# Patient Record
Sex: Female | Born: 1975 | Race: Black or African American | Hispanic: No | Marital: Single | Smoking: Former smoker
Health system: Southern US, Community
[De-identification: ages and names within clinical notes are randomized; demographics above are authoritative.]

## PROBLEM LIST (undated history)

## (undated) DIAGNOSIS — F1910 Other psychoactive substance abuse, uncomplicated: Secondary | ICD-10-CM

## (undated) DIAGNOSIS — Z789 Other specified health status: Secondary | ICD-10-CM

## (undated) DIAGNOSIS — K56609 Unspecified intestinal obstruction, unspecified as to partial versus complete obstruction: Secondary | ICD-10-CM

## (undated) DIAGNOSIS — F419 Anxiety disorder, unspecified: Secondary | ICD-10-CM

## (undated) HISTORY — DX: Anxiety disorder, unspecified: F41.9

---

## 1997-06-21 ENCOUNTER — Ambulatory Visit (HOSPITAL_COMMUNITY): Admission: RE | Admit: 1997-06-21 | Discharge: 1997-06-21 | Payer: Self-pay | Admitting: *Deleted

## 1997-09-05 ENCOUNTER — Other Ambulatory Visit: Admission: RE | Admit: 1997-09-05 | Discharge: 1997-09-05 | Payer: Self-pay | Admitting: Obstetrics

## 1997-09-10 ENCOUNTER — Ambulatory Visit (HOSPITAL_COMMUNITY): Admission: RE | Admit: 1997-09-10 | Discharge: 1997-09-10 | Payer: Self-pay | Admitting: Obstetrics

## 1998-02-24 ENCOUNTER — Emergency Department (HOSPITAL_COMMUNITY): Admission: EM | Admit: 1998-02-24 | Discharge: 1998-02-24 | Payer: Self-pay | Admitting: Emergency Medicine

## 1998-04-11 ENCOUNTER — Inpatient Hospital Stay (HOSPITAL_COMMUNITY): Admission: AD | Admit: 1998-04-11 | Discharge: 1998-04-11 | Payer: Self-pay | Admitting: *Deleted

## 1998-05-04 ENCOUNTER — Inpatient Hospital Stay (HOSPITAL_COMMUNITY): Admission: AD | Admit: 1998-05-04 | Discharge: 1998-05-04 | Payer: Self-pay | Admitting: *Deleted

## 1998-08-12 ENCOUNTER — Inpatient Hospital Stay (HOSPITAL_COMMUNITY): Admission: AD | Admit: 1998-08-12 | Discharge: 1998-08-12 | Payer: Self-pay | Admitting: Obstetrics

## 1998-08-14 ENCOUNTER — Inpatient Hospital Stay (HOSPITAL_COMMUNITY): Admission: AD | Admit: 1998-08-14 | Discharge: 1998-08-14 | Payer: Self-pay | Admitting: *Deleted

## 1998-08-18 ENCOUNTER — Inpatient Hospital Stay (HOSPITAL_COMMUNITY): Admission: AD | Admit: 1998-08-18 | Discharge: 1998-08-18 | Payer: Self-pay | Admitting: *Deleted

## 1998-08-20 ENCOUNTER — Inpatient Hospital Stay (HOSPITAL_COMMUNITY): Admission: AD | Admit: 1998-08-20 | Discharge: 1998-08-20 | Payer: Self-pay | Admitting: *Deleted

## 1998-10-29 ENCOUNTER — Inpatient Hospital Stay (HOSPITAL_COMMUNITY): Admission: EM | Admit: 1998-10-29 | Discharge: 1998-10-29 | Payer: Self-pay | Admitting: *Deleted

## 1998-12-13 ENCOUNTER — Emergency Department (HOSPITAL_COMMUNITY): Admission: EM | Admit: 1998-12-13 | Discharge: 1998-12-13 | Payer: Self-pay | Admitting: Emergency Medicine

## 1998-12-31 ENCOUNTER — Inpatient Hospital Stay (HOSPITAL_COMMUNITY): Admission: AD | Admit: 1998-12-31 | Discharge: 1998-12-31 | Payer: Self-pay | Admitting: *Deleted

## 1999-01-14 ENCOUNTER — Inpatient Hospital Stay (HOSPITAL_COMMUNITY): Admission: AD | Admit: 1999-01-14 | Discharge: 1999-01-14 | Payer: Self-pay | Admitting: *Deleted

## 1999-01-16 ENCOUNTER — Emergency Department (HOSPITAL_COMMUNITY): Admission: EM | Admit: 1999-01-16 | Discharge: 1999-01-16 | Payer: Self-pay | Admitting: Emergency Medicine

## 1999-02-06 ENCOUNTER — Encounter: Payer: Self-pay | Admitting: Emergency Medicine

## 1999-02-06 ENCOUNTER — Emergency Department (HOSPITAL_COMMUNITY): Admission: EM | Admit: 1999-02-06 | Discharge: 1999-02-06 | Payer: Self-pay | Admitting: Emergency Medicine

## 1999-02-09 ENCOUNTER — Inpatient Hospital Stay (HOSPITAL_COMMUNITY): Admission: AD | Admit: 1999-02-09 | Discharge: 1999-02-09 | Payer: Self-pay | Admitting: *Deleted

## 1999-02-22 ENCOUNTER — Inpatient Hospital Stay (HOSPITAL_COMMUNITY): Admission: AD | Admit: 1999-02-22 | Discharge: 1999-02-22 | Payer: Self-pay | Admitting: Obstetrics & Gynecology

## 1999-03-21 ENCOUNTER — Inpatient Hospital Stay (HOSPITAL_COMMUNITY): Admission: AD | Admit: 1999-03-21 | Discharge: 1999-03-21 | Payer: Self-pay | Admitting: Obstetrics and Gynecology

## 1999-03-25 ENCOUNTER — Ambulatory Visit (HOSPITAL_COMMUNITY): Admission: RE | Admit: 1999-03-25 | Discharge: 1999-03-25 | Payer: Self-pay | Admitting: *Deleted

## 1999-04-11 ENCOUNTER — Inpatient Hospital Stay (HOSPITAL_COMMUNITY): Admission: AD | Admit: 1999-04-11 | Discharge: 1999-04-11 | Payer: Self-pay | Admitting: Obstetrics and Gynecology

## 1999-04-21 ENCOUNTER — Inpatient Hospital Stay (HOSPITAL_COMMUNITY): Admission: AD | Admit: 1999-04-21 | Discharge: 1999-04-21 | Payer: Self-pay | Admitting: *Deleted

## 1999-04-22 ENCOUNTER — Ambulatory Visit (HOSPITAL_COMMUNITY): Admission: RE | Admit: 1999-04-22 | Discharge: 1999-04-22 | Payer: Self-pay | Admitting: Obstetrics and Gynecology

## 1999-04-22 ENCOUNTER — Encounter: Payer: Self-pay | Admitting: Obstetrics and Gynecology

## 1999-05-02 ENCOUNTER — Emergency Department (HOSPITAL_COMMUNITY): Admission: EM | Admit: 1999-05-02 | Discharge: 1999-05-02 | Payer: Self-pay | Admitting: Emergency Medicine

## 1999-05-24 ENCOUNTER — Inpatient Hospital Stay (HOSPITAL_COMMUNITY): Admission: AD | Admit: 1999-05-24 | Discharge: 1999-05-24 | Payer: Self-pay | Admitting: *Deleted

## 1999-06-20 ENCOUNTER — Inpatient Hospital Stay (HOSPITAL_COMMUNITY): Admission: AD | Admit: 1999-06-20 | Discharge: 1999-06-20 | Payer: Self-pay | Admitting: Obstetrics & Gynecology

## 1999-07-06 ENCOUNTER — Inpatient Hospital Stay (HOSPITAL_COMMUNITY): Admission: AD | Admit: 1999-07-06 | Discharge: 1999-07-06 | Payer: Self-pay | Admitting: Obstetrics

## 1999-07-18 ENCOUNTER — Inpatient Hospital Stay (HOSPITAL_COMMUNITY): Admission: AD | Admit: 1999-07-18 | Discharge: 1999-07-18 | Payer: Self-pay | Admitting: Obstetrics

## 1999-07-24 ENCOUNTER — Inpatient Hospital Stay (HOSPITAL_COMMUNITY): Admission: AD | Admit: 1999-07-24 | Discharge: 1999-07-24 | Payer: Self-pay | Admitting: Obstetrics

## 1999-07-27 ENCOUNTER — Inpatient Hospital Stay (HOSPITAL_COMMUNITY): Admission: AD | Admit: 1999-07-27 | Discharge: 1999-07-27 | Payer: Self-pay | Admitting: *Deleted

## 1999-08-16 ENCOUNTER — Inpatient Hospital Stay (HOSPITAL_COMMUNITY): Admission: AD | Admit: 1999-08-16 | Discharge: 1999-08-16 | Payer: Self-pay | Admitting: Obstetrics

## 1999-08-25 ENCOUNTER — Inpatient Hospital Stay (HOSPITAL_COMMUNITY): Admission: AD | Admit: 1999-08-25 | Discharge: 1999-08-25 | Payer: Self-pay | Admitting: Obstetrics

## 1999-08-27 ENCOUNTER — Inpatient Hospital Stay (HOSPITAL_COMMUNITY): Admission: AD | Admit: 1999-08-27 | Discharge: 1999-08-27 | Payer: Self-pay | Admitting: Obstetrics

## 1999-09-01 ENCOUNTER — Encounter (HOSPITAL_COMMUNITY): Admission: RE | Admit: 1999-09-01 | Discharge: 1999-09-04 | Payer: Self-pay | Admitting: Obstetrics

## 1999-09-03 ENCOUNTER — Inpatient Hospital Stay (HOSPITAL_COMMUNITY): Admission: AD | Admit: 1999-09-03 | Discharge: 1999-09-06 | Payer: Self-pay | Admitting: Internal Medicine

## 2000-01-03 ENCOUNTER — Emergency Department (HOSPITAL_COMMUNITY): Admission: EM | Admit: 2000-01-03 | Discharge: 2000-01-03 | Payer: Self-pay | Admitting: Emergency Medicine

## 2000-01-03 ENCOUNTER — Encounter: Payer: Self-pay | Admitting: Emergency Medicine

## 2000-03-02 ENCOUNTER — Inpatient Hospital Stay (HOSPITAL_COMMUNITY): Admission: AD | Admit: 2000-03-02 | Discharge: 2000-03-02 | Payer: Self-pay | Admitting: Obstetrics

## 2000-03-23 ENCOUNTER — Inpatient Hospital Stay (HOSPITAL_COMMUNITY): Admission: AD | Admit: 2000-03-23 | Discharge: 2000-03-23 | Payer: Self-pay | Admitting: Obstetrics

## 2000-05-03 ENCOUNTER — Encounter: Payer: Self-pay | Admitting: Obstetrics

## 2000-05-03 ENCOUNTER — Ambulatory Visit (HOSPITAL_COMMUNITY): Admission: RE | Admit: 2000-05-03 | Discharge: 2000-05-03 | Payer: Self-pay | Admitting: Obstetrics

## 2000-06-01 ENCOUNTER — Inpatient Hospital Stay (HOSPITAL_COMMUNITY): Admission: AD | Admit: 2000-06-01 | Discharge: 2000-06-01 | Payer: Self-pay | Admitting: Obstetrics

## 2000-06-29 ENCOUNTER — Inpatient Hospital Stay (HOSPITAL_COMMUNITY): Admission: AD | Admit: 2000-06-29 | Discharge: 2000-06-29 | Payer: Self-pay | Admitting: Obstetrics

## 2000-09-09 ENCOUNTER — Inpatient Hospital Stay (HOSPITAL_COMMUNITY): Admission: AD | Admit: 2000-09-09 | Discharge: 2000-09-09 | Payer: Self-pay | Admitting: Obstetrics

## 2000-09-11 ENCOUNTER — Inpatient Hospital Stay (HOSPITAL_COMMUNITY): Admission: AD | Admit: 2000-09-11 | Discharge: 2000-09-11 | Payer: Self-pay | Admitting: Obstetrics

## 2000-09-26 ENCOUNTER — Inpatient Hospital Stay (HOSPITAL_COMMUNITY): Admission: AD | Admit: 2000-09-26 | Discharge: 2000-09-26 | Payer: Self-pay | Admitting: Obstetrics

## 2000-09-29 ENCOUNTER — Inpatient Hospital Stay (HOSPITAL_COMMUNITY): Admission: AD | Admit: 2000-09-29 | Discharge: 2000-10-02 | Payer: Self-pay | Admitting: Obstetrics

## 2000-10-04 ENCOUNTER — Encounter: Admission: RE | Admit: 2000-10-04 | Discharge: 2000-11-03 | Payer: Self-pay | Admitting: Obstetrics

## 2000-11-04 ENCOUNTER — Encounter: Admission: RE | Admit: 2000-11-04 | Discharge: 2000-12-04 | Payer: Self-pay | Admitting: Obstetrics

## 2000-12-05 ENCOUNTER — Encounter: Admission: RE | Admit: 2000-12-05 | Discharge: 2001-01-04 | Payer: Self-pay | Admitting: Obstetrics

## 2001-02-04 ENCOUNTER — Encounter: Admission: RE | Admit: 2001-02-04 | Discharge: 2001-03-06 | Payer: Self-pay | Admitting: Obstetrics

## 2001-03-12 ENCOUNTER — Inpatient Hospital Stay (HOSPITAL_COMMUNITY): Admission: AD | Admit: 2001-03-12 | Discharge: 2001-03-12 | Payer: Self-pay | Admitting: Obstetrics

## 2001-03-24 ENCOUNTER — Emergency Department (HOSPITAL_COMMUNITY): Admission: EM | Admit: 2001-03-24 | Discharge: 2001-03-24 | Payer: Self-pay | Admitting: Emergency Medicine

## 2001-03-31 ENCOUNTER — Inpatient Hospital Stay (HOSPITAL_COMMUNITY): Admission: AD | Admit: 2001-03-31 | Discharge: 2001-03-31 | Payer: Self-pay | Admitting: Obstetrics

## 2001-04-06 ENCOUNTER — Encounter: Admission: RE | Admit: 2001-04-06 | Discharge: 2001-05-06 | Payer: Self-pay | Admitting: Obstetrics

## 2001-05-07 ENCOUNTER — Encounter: Admission: RE | Admit: 2001-05-07 | Discharge: 2001-06-06 | Payer: Self-pay | Admitting: Obstetrics

## 2001-05-11 ENCOUNTER — Inpatient Hospital Stay (HOSPITAL_COMMUNITY): Admission: AD | Admit: 2001-05-11 | Discharge: 2001-05-11 | Payer: Self-pay | Admitting: Obstetrics and Gynecology

## 2001-05-17 ENCOUNTER — Observation Stay (HOSPITAL_COMMUNITY): Admission: AD | Admit: 2001-05-17 | Discharge: 2001-05-17 | Payer: Self-pay | Admitting: Obstetrics and Gynecology

## 2001-05-18 ENCOUNTER — Inpatient Hospital Stay (HOSPITAL_COMMUNITY): Admission: AD | Admit: 2001-05-18 | Discharge: 2001-05-18 | Payer: Self-pay | Admitting: Obstetrics and Gynecology

## 2001-05-19 ENCOUNTER — Inpatient Hospital Stay (HOSPITAL_COMMUNITY): Admission: AD | Admit: 2001-05-19 | Discharge: 2001-05-19 | Payer: Self-pay | Admitting: Obstetrics

## 2001-07-01 ENCOUNTER — Observation Stay (HOSPITAL_COMMUNITY): Admission: AD | Admit: 2001-07-01 | Discharge: 2001-07-02 | Payer: Self-pay | Admitting: Obstetrics

## 2001-07-01 ENCOUNTER — Encounter: Payer: Self-pay | Admitting: General Surgery

## 2001-07-05 ENCOUNTER — Encounter: Admission: RE | Admit: 2001-07-05 | Discharge: 2001-08-04 | Payer: Self-pay | Admitting: Obstetrics

## 2001-08-19 ENCOUNTER — Inpatient Hospital Stay (HOSPITAL_COMMUNITY): Admission: AD | Admit: 2001-08-19 | Discharge: 2001-08-19 | Payer: Self-pay | Admitting: Obstetrics

## 2001-08-21 ENCOUNTER — Emergency Department (HOSPITAL_COMMUNITY): Admission: EM | Admit: 2001-08-21 | Discharge: 2001-08-21 | Payer: Self-pay | Admitting: Emergency Medicine

## 2001-09-04 ENCOUNTER — Encounter: Admission: RE | Admit: 2001-09-04 | Discharge: 2001-10-04 | Payer: Self-pay | Admitting: Obstetrics

## 2001-09-24 ENCOUNTER — Inpatient Hospital Stay (HOSPITAL_COMMUNITY): Admission: AD | Admit: 2001-09-24 | Discharge: 2001-09-24 | Payer: Self-pay | Admitting: Obstetrics

## 2001-09-28 ENCOUNTER — Inpatient Hospital Stay (HOSPITAL_COMMUNITY): Admission: AD | Admit: 2001-09-28 | Discharge: 2001-09-28 | Payer: Self-pay | Admitting: Obstetrics

## 2001-10-14 ENCOUNTER — Encounter: Payer: Self-pay | Admitting: Obstetrics

## 2001-10-14 ENCOUNTER — Inpatient Hospital Stay (HOSPITAL_COMMUNITY): Admission: AD | Admit: 2001-10-14 | Discharge: 2001-10-14 | Payer: Self-pay | Admitting: Obstetrics

## 2001-10-19 ENCOUNTER — Inpatient Hospital Stay (HOSPITAL_COMMUNITY): Admission: AD | Admit: 2001-10-19 | Discharge: 2001-10-21 | Payer: Self-pay | Admitting: Obstetrics

## 2001-10-19 ENCOUNTER — Encounter (INDEPENDENT_AMBULATORY_CARE_PROVIDER_SITE_OTHER): Payer: Self-pay | Admitting: *Deleted

## 2001-11-04 ENCOUNTER — Encounter: Admission: RE | Admit: 2001-11-04 | Discharge: 2001-12-04 | Payer: Self-pay | Admitting: Obstetrics

## 2001-12-05 ENCOUNTER — Encounter: Admission: RE | Admit: 2001-12-05 | Discharge: 2002-01-04 | Payer: Self-pay | Admitting: Obstetrics

## 2002-02-12 ENCOUNTER — Emergency Department (HOSPITAL_COMMUNITY): Admission: EM | Admit: 2002-02-12 | Discharge: 2002-02-12 | Payer: Self-pay | Admitting: Emergency Medicine

## 2002-02-12 ENCOUNTER — Encounter: Payer: Self-pay | Admitting: *Deleted

## 2002-02-16 ENCOUNTER — Emergency Department (HOSPITAL_COMMUNITY): Admission: EM | Admit: 2002-02-16 | Discharge: 2002-02-16 | Payer: Self-pay | Admitting: Emergency Medicine

## 2002-04-29 ENCOUNTER — Inpatient Hospital Stay (HOSPITAL_COMMUNITY): Admission: AD | Admit: 2002-04-29 | Discharge: 2002-04-29 | Payer: Self-pay | Admitting: Obstetrics

## 2002-07-12 ENCOUNTER — Encounter: Payer: Self-pay | Admitting: Emergency Medicine

## 2002-07-13 ENCOUNTER — Inpatient Hospital Stay (HOSPITAL_COMMUNITY): Admission: AD | Admit: 2002-07-13 | Discharge: 2002-07-13 | Payer: Self-pay | Admitting: *Deleted

## 2003-02-12 ENCOUNTER — Inpatient Hospital Stay (HOSPITAL_COMMUNITY): Admission: AD | Admit: 2003-02-12 | Discharge: 2003-02-12 | Payer: Self-pay | Admitting: Obstetrics & Gynecology

## 2003-02-24 ENCOUNTER — Emergency Department (HOSPITAL_COMMUNITY): Admission: EM | Admit: 2003-02-24 | Discharge: 2003-02-24 | Payer: Self-pay | Admitting: Emergency Medicine

## 2003-03-05 ENCOUNTER — Emergency Department (HOSPITAL_COMMUNITY): Admission: AD | Admit: 2003-03-05 | Discharge: 2003-03-05 | Payer: Self-pay | Admitting: Family Medicine

## 2003-08-20 ENCOUNTER — Emergency Department (HOSPITAL_COMMUNITY): Admission: EM | Admit: 2003-08-20 | Discharge: 2003-08-20 | Payer: Self-pay | Admitting: Emergency Medicine

## 2003-09-12 ENCOUNTER — Inpatient Hospital Stay (HOSPITAL_COMMUNITY): Admission: AD | Admit: 2003-09-12 | Discharge: 2003-09-12 | Payer: Self-pay | Admitting: Obstetrics and Gynecology

## 2003-12-08 ENCOUNTER — Inpatient Hospital Stay (HOSPITAL_COMMUNITY): Admission: AD | Admit: 2003-12-08 | Discharge: 2003-12-08 | Payer: Self-pay

## 2004-01-03 ENCOUNTER — Inpatient Hospital Stay (HOSPITAL_COMMUNITY): Admission: AD | Admit: 2004-01-03 | Discharge: 2004-01-03 | Payer: Self-pay | Admitting: Obstetrics & Gynecology

## 2004-02-19 ENCOUNTER — Inpatient Hospital Stay (HOSPITAL_COMMUNITY): Admission: AD | Admit: 2004-02-19 | Discharge: 2004-02-19 | Payer: Self-pay | Admitting: *Deleted

## 2004-02-22 ENCOUNTER — Inpatient Hospital Stay (HOSPITAL_COMMUNITY): Admission: AD | Admit: 2004-02-22 | Discharge: 2004-02-22 | Payer: Self-pay | Admitting: Obstetrics and Gynecology

## 2004-03-17 ENCOUNTER — Inpatient Hospital Stay (HOSPITAL_COMMUNITY): Admission: AD | Admit: 2004-03-17 | Discharge: 2004-03-17 | Payer: Self-pay | Admitting: Obstetrics and Gynecology

## 2004-04-19 ENCOUNTER — Inpatient Hospital Stay (HOSPITAL_COMMUNITY): Admission: AD | Admit: 2004-04-19 | Discharge: 2004-04-19 | Payer: Self-pay | Admitting: *Deleted

## 2004-04-22 ENCOUNTER — Encounter: Payer: Self-pay | Admitting: *Deleted

## 2004-04-23 ENCOUNTER — Inpatient Hospital Stay (HOSPITAL_COMMUNITY): Admission: EM | Admit: 2004-04-23 | Discharge: 2004-04-25 | Payer: Self-pay | Admitting: Emergency Medicine

## 2004-07-14 ENCOUNTER — Inpatient Hospital Stay (HOSPITAL_COMMUNITY): Admission: AD | Admit: 2004-07-14 | Discharge: 2004-07-14 | Payer: Self-pay | Admitting: Family Medicine

## 2004-08-27 ENCOUNTER — Inpatient Hospital Stay (HOSPITAL_COMMUNITY): Admission: AD | Admit: 2004-08-27 | Discharge: 2004-08-27 | Payer: Self-pay | Admitting: Obstetrics and Gynecology

## 2004-10-29 ENCOUNTER — Ambulatory Visit: Payer: Self-pay | Admitting: *Deleted

## 2004-10-29 ENCOUNTER — Inpatient Hospital Stay (HOSPITAL_COMMUNITY): Admission: AD | Admit: 2004-10-29 | Discharge: 2004-10-29 | Payer: Self-pay | Admitting: Obstetrics and Gynecology

## 2004-11-18 ENCOUNTER — Inpatient Hospital Stay (HOSPITAL_COMMUNITY): Admission: AD | Admit: 2004-11-18 | Discharge: 2004-11-18 | Payer: Self-pay | Admitting: Family Medicine

## 2004-11-22 ENCOUNTER — Ambulatory Visit: Payer: Self-pay | Admitting: Obstetrics and Gynecology

## 2004-11-22 ENCOUNTER — Inpatient Hospital Stay (HOSPITAL_COMMUNITY): Admission: AD | Admit: 2004-11-22 | Discharge: 2004-11-25 | Payer: Self-pay | Admitting: Obstetrics and Gynecology

## 2004-11-26 ENCOUNTER — Inpatient Hospital Stay (HOSPITAL_COMMUNITY): Admission: AD | Admit: 2004-11-26 | Discharge: 2004-11-26 | Payer: Self-pay | Admitting: *Deleted

## 2004-11-30 ENCOUNTER — Ambulatory Visit: Payer: Self-pay | Admitting: Family Medicine

## 2004-11-30 ENCOUNTER — Inpatient Hospital Stay (HOSPITAL_COMMUNITY): Admission: AD | Admit: 2004-11-30 | Discharge: 2004-11-30 | Payer: Self-pay | Admitting: Family Medicine

## 2004-12-02 ENCOUNTER — Ambulatory Visit: Payer: Self-pay | Admitting: Obstetrics and Gynecology

## 2004-12-02 ENCOUNTER — Inpatient Hospital Stay (HOSPITAL_COMMUNITY): Admission: AD | Admit: 2004-12-02 | Discharge: 2004-12-04 | Payer: Self-pay | Admitting: Obstetrics & Gynecology

## 2004-12-18 ENCOUNTER — Emergency Department (HOSPITAL_COMMUNITY): Admission: EM | Admit: 2004-12-18 | Discharge: 2004-12-18 | Payer: Self-pay | Admitting: Emergency Medicine

## 2005-08-01 ENCOUNTER — Inpatient Hospital Stay (HOSPITAL_COMMUNITY): Admission: AD | Admit: 2005-08-01 | Discharge: 2005-08-01 | Payer: Self-pay | Admitting: Obstetrics & Gynecology

## 2005-08-12 ENCOUNTER — Emergency Department (HOSPITAL_COMMUNITY): Admission: EM | Admit: 2005-08-12 | Discharge: 2005-08-12 | Payer: Self-pay | Admitting: Emergency Medicine

## 2005-09-05 ENCOUNTER — Emergency Department (HOSPITAL_COMMUNITY): Admission: EM | Admit: 2005-09-05 | Discharge: 2005-09-06 | Payer: Self-pay | Admitting: Emergency Medicine

## 2005-09-30 ENCOUNTER — Inpatient Hospital Stay (HOSPITAL_COMMUNITY): Admission: AD | Admit: 2005-09-30 | Discharge: 2005-09-30 | Payer: Self-pay | Admitting: Gynecology

## 2005-10-09 ENCOUNTER — Inpatient Hospital Stay (HOSPITAL_COMMUNITY): Admission: RE | Admit: 2005-10-09 | Discharge: 2005-10-12 | Payer: Self-pay | Admitting: Psychiatry

## 2005-10-10 ENCOUNTER — Ambulatory Visit: Payer: Self-pay | Admitting: Psychiatry

## 2005-11-05 ENCOUNTER — Emergency Department (HOSPITAL_COMMUNITY): Admission: EM | Admit: 2005-11-05 | Discharge: 2005-11-05 | Payer: Self-pay | Admitting: Emergency Medicine

## 2005-12-02 ENCOUNTER — Inpatient Hospital Stay (HOSPITAL_COMMUNITY): Admission: AD | Admit: 2005-12-02 | Discharge: 2005-12-02 | Payer: Self-pay | Admitting: Obstetrics and Gynecology

## 2006-06-20 ENCOUNTER — Inpatient Hospital Stay (HOSPITAL_COMMUNITY): Admission: AD | Admit: 2006-06-20 | Discharge: 2006-06-20 | Payer: Self-pay | Admitting: Obstetrics

## 2006-11-12 ENCOUNTER — Emergency Department (HOSPITAL_COMMUNITY): Admission: EM | Admit: 2006-11-12 | Discharge: 2006-11-12 | Payer: Self-pay | Admitting: Emergency Medicine

## 2006-11-19 ENCOUNTER — Emergency Department (HOSPITAL_COMMUNITY): Admission: EM | Admit: 2006-11-19 | Discharge: 2006-11-19 | Payer: Self-pay | Admitting: Emergency Medicine

## 2007-03-15 ENCOUNTER — Emergency Department (HOSPITAL_COMMUNITY): Admission: EM | Admit: 2007-03-15 | Discharge: 2007-03-15 | Payer: Self-pay | Admitting: Emergency Medicine

## 2007-11-06 ENCOUNTER — Emergency Department (HOSPITAL_COMMUNITY): Admission: EM | Admit: 2007-11-06 | Discharge: 2007-11-06 | Payer: Self-pay | Admitting: Emergency Medicine

## 2008-09-05 ENCOUNTER — Emergency Department (HOSPITAL_COMMUNITY): Admission: EM | Admit: 2008-09-05 | Discharge: 2008-09-05 | Payer: Self-pay | Admitting: Emergency Medicine

## 2009-06-24 ENCOUNTER — Emergency Department (HOSPITAL_COMMUNITY): Admission: EM | Admit: 2009-06-24 | Discharge: 2009-06-25 | Payer: Self-pay | Admitting: Emergency Medicine

## 2010-04-27 ENCOUNTER — Encounter: Payer: Self-pay | Admitting: Emergency Medicine

## 2010-06-07 ENCOUNTER — Emergency Department (HOSPITAL_COMMUNITY)
Admission: EM | Admit: 2010-06-07 | Discharge: 2010-06-07 | Disposition: A | Discharge status: Home / Self Care | Payer: No Typology Code available for payment source | Attending: Emergency Medicine | Admitting: Emergency Medicine

## 2010-06-07 DIAGNOSIS — T1490XA Injury, unspecified, initial encounter: Secondary | ICD-10-CM | POA: Insufficient documentation

## 2010-06-07 DIAGNOSIS — M549 Dorsalgia, unspecified: Secondary | ICD-10-CM | POA: Insufficient documentation

## 2010-06-07 DIAGNOSIS — Y92410 Unspecified street and highway as the place of occurrence of the external cause: Secondary | ICD-10-CM | POA: Insufficient documentation

## 2010-06-27 LAB — POCT I-STAT, CHEM 8
Glucose, Bld: 93 mg/dL (ref 70–99)
HCT: 41 % (ref 36.0–46.0)
Hemoglobin: 13.9 g/dL (ref 12.0–15.0)
Potassium: 3.7 mEq/L (ref 3.5–5.1)

## 2010-06-27 LAB — DIFFERENTIAL
Basophils Relative: 1 % (ref 0–1)
Lymphocytes Relative: 27 % (ref 12–46)
Monocytes Absolute: 0.7 10*3/uL (ref 0.1–1.0)
Monocytes Relative: 7 % (ref 3–12)
Neutrophils Relative %: 64 % (ref 43–77)

## 2010-06-27 LAB — RAPID URINE DRUG SCREEN, HOSP PERFORMED
Barbiturates: NOT DETECTED
Benzodiazepines: NOT DETECTED
Tetrahydrocannabinol: NOT DETECTED

## 2010-06-27 LAB — CBC
HCT: 36.3 % (ref 36.0–46.0)
Hemoglobin: 12.3 g/dL (ref 12.0–15.0)
MCV: 89.3 fL (ref 78.0–100.0)
Platelets: 304 10*3/uL (ref 150–400)
RBC: 4.07 MIL/uL (ref 3.87–5.11)
RDW: 15.6 % — ABNORMAL HIGH (ref 11.5–15.5)

## 2010-06-27 LAB — URINALYSIS, ROUTINE W REFLEX MICROSCOPIC
Glucose, UA: NEGATIVE mg/dL
Hgb urine dipstick: NEGATIVE
Urobilinogen, UA: 0.2 mg/dL (ref 0.0–1.0)
pH: 6.5 (ref 5.0–8.0)

## 2010-08-22 NOTE — Op Note (Signed)
Christine Berry, Christine Berry           ACCOUNT NO.:  [id number]   MEDICAL RECORD NO.:  [id number]          PATIENT TYPE:  INP   LOCATION:  9320                          FACILITY:  [facility]   PHYSICIAN:  Sigmund I. Patsi Sears, M.D.DATE OF BIRTH:  06-May-1975   DATE OF PROCEDURE:  11/23/2004  DATE OF DISCHARGE:                                 OPERATIVE REPORT   PREOPERATIVE DIAGNOSIS:  Infected urethral diverticular abscess.   POSTOPERATIVE DIAGNOSIS:  Infected urethral diverticular abscess.   OPERATION/PROCEDURE:  Incision and drainage of urethral diverticular  abscess.   SURGEON:  Sigmund I. Patsi Sears, M.D.   ASSISTANT:  Clifton Custard, P.A.-C.   PREPARATION:  After appropriate preanesthesia, the patient was brought to  the operating room and placed on the operating table in the dorsal supine  position.  She was then replaced in the right lateral decubitus position  dorsal lithotomy position where spinal anesthesia was introduced.  She was  then replaced in the dorsal lithotomy position where the pubis was prepped  with Betadine solution and draped in the usual fashion.   REVIEW OF HISTORY:  This 35 year old African American female, [redacted] weeks  pregnant, was found to have a large hard, tender, erythematous, infected,  midline, distal urethral diverticulum with abscess and drainage.  She is not  for incision and drainage of abscess.   DESCRIPTION OF PROCEDURE:  With the patient in the lithotomy position,  inspection again yielded a large, hard, erythematous mass in the midline of  the distal urethra.  Cystoscopy revealed significant inflammation of the  urethra with normal-appearing bladder base with no stones, no tumor, no  diverticular formation.  I did not see the opening of the diverticulum from  the urethra.  I did not see any pus in the urethra.   The bladder was evaluated with both the 12-degree and the 70-degree lenses,  __________ .  A 16 Foley catheter was placed in the bladder  drained of  fluid.  A midline incision was then made over the urethral abscess and  periurethral necrotic tissue debrided.  Culture was obtained and sent to the  laboratory for evaluation.  Copious irrigation with sterile water was then  followed by packing in the abscess area and also vaginal packing.  This will  all be removed in 24 hours.  Foley catheter will remain to straight  drainage.  The patient was then taken to the recovery room in stable  condition.      Sigmund I. Patsi Sears, M.D.  Electronically Signed     SIT/MEDQ  D:  11/23/2004  T:  11/24/2004  Job:  [id number]

## 2010-08-22 NOTE — Discharge Summary (Signed)
NAME:  JESI, JURGENS NO.:  [id number]   MEDICAL RECORD NO.:  [id number]          PATIENT TYPE:  IPS   LOCATION:  0307                          [facility]   PHYSICIAN:  Geoffery Lyons, M.D.      DATE OF BIRTH:  1975-11-30   DATE OF ADMISSION:  10/09/2005  DATE OF DISCHARGE:  10/12/2005                                 DISCHARGE SUMMARY   CHIEF COMPLAINT AND PRESENT ILLNESS:  This was the first admission to [facility] for this 35 year old single African-American female,  voluntarily admitted, 14 weeks 4 days gestation, UDS positive for cocaine,  presently requiring detox so she could be admitted to [facility].  Fourteen weeks' pregnant.  Started using drugs about four years prior to  this admission.   PAST PSYCHIATRIC HISTORY:  Denies prior treatment.   ALCOHOL AND DRUG HISTORY:  As already stated.  Crack, $100 per day.  Using  40 ounces of beer per days.   MEDICAL PROBLEMS:  Bronchitis.   MEDICATIONS:  Zithromax, albuterol inhaler.   PHYSICAL EXAMINATION:  Performed and failed to show any acute findings other  than some generalized wheezes.   CBC:  White blood cells 17.3, hemoglobin 11.4, hematocrit 33.9.  Drug screen  positive for cocaine.  Blood chemistry:  Sodium 135, potassium 3.6, glucose  97.  Liver enzymes:  SGOT 15, SGPT 12.   MENTAL STATUS EXAMINATION:  An alert, cooperative female.  Mood:  Anxiety.  Affect:  Anxiety.  Endorsed that she was committed to abstaining, and that  she only wanted to stay three days so she could be detoxed and go to [facility].  As the hospitalization progressed, she was detoxed.  She improved.   ADMITTING DIAGNOSES:  AXIS I:  Cocaine and alcohol abuse.  AXIS II:  No diagnosis.  AXIS III:  Fourteen weeks' pregnant.  AXIS IV:  Moderate.  AXIS V:  Global Assessment of Functioning upon admission 35; highest Global  Assessment of Functioning in the last year 55-60.   COURSE IN HOSPITAL:  She  was admitted.  She was maintained on Zithromax,  albuterol inhaler, and Cepacol lozenges.  She endorsed alcohol and cocaine  abuse.  Having a difficult time, she was going to [facility], wanting to  be detoxed.  She was wanting to do well, wanting to keep the baby.  Somatically focused with the bronchitis and the coughing and restless.  The  hospitalization progressed uneventfully, and on July 9, she was in full  contact with reality.  There were no active suicidal or homicidal ideas.  No  hallucinations, no delusions.  No overt withdrawal.  She was able to  communicate with her case worker.  There was apparently no bed waiting for  her, but she was safe where she was going to go, waiting for availability of  a bed at [facility].  There were no active suicidal or homicidal ideas.  However, she was markedly improved.   DISCHARGE DIAGNOSES:  AXIS I:  Cocaine and alcohol abuse.  AXIS II:  No diagnosis.  AXIS III:  __________  weeks'  pregnant.  Bronchitis.  AXIS IV:  Moderate.  AXIS V:  Global Assessment of Functioning upon discharge 50.   Discharged on Zithromax, albuterol inhaler as needed.  Follow up with [facility].      Geoffery Lyons, M.D.  Electronically Signed     IL/MEDQ  D:  10/26/2005  T:  10/27/2005  Job:  [id number]
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
## 2010-08-22 NOTE — H&P (Signed)
NAMERINNAH, PEPPEL NO.:  [id number]   MEDICAL RECORD NO.:  [id number]           PATIENT TYPE:   LOCATION:                               FACILITY:  [facility]   PHYSICIAN:  Jackie Plum, M.D.DATE OF BIRTH:  1975/10/12   DATE OF ADMISSION:  04/23/2004  DATE OF DISCHARGE:                                HISTORY & PHYSICAL   CHIEF COMPLAINT:  Fever and cough.   HISTORY OF PRESENT ILLNESS:  The patient is a 35 year old lady who is 6  weeks' pregnant, presenting with 3-day history of progressively worsening  fever, cough, myalgia, chills, nausea and vomiting.  Her cough is  nonproductive.  She has some mild-to-moderate difficulty with her breathing  and some wheezes.  She denies any true chest pain, but says that her chest  hurts when she coughs.  She has some headaches without any dysuria or  frequency of micturition.  She went to [facility] apparently and  Dr. Conni Elliot ordered ultrasound which indicated that she has a 6-  week 4-day viable intrauterine pregnancy and she was referred to the ED for  further evaluation and management.   PAST MEDICAL HISTORY:  Past medical history is negative for history of  diabetes or hypertension.  She has had a previous history of bronchitis.  She denies any previous history of bronchial asthma.   MEDICATION HISTORY:  The patient is not on any current medications.   ALLERGIES:  She denies any history of allergies.   PRIMARY CARE PHYSICIAN:  She does not have a primary care physician.   SOCIAL HISTORY:  She smokes a pack of cigarettes daily; the last smoke was  about 3 days when her symptoms started.  She drinks alcohol on an occasional  basis during social activities.   REVIEW OF SYSTEMS:  Significant positives and negatives are as listed on the  HPI; the rest is unremarkable.   PHYSICAL EXAMINATION:  VITAL SIGNS:  BP was 118/67, pulse 117, respirations  20, temperature 103.8 degrees Fahrenheit; her  pulse has come down to 98 per  minute at time of my evaluation, SAT of 92% on room air.  GENERAL:  On general examination, she looks ill, coughing incessantly.  Patient is in no acute cardiopulmonary distress.  NECK:  Neck was supple.  No JVD.  No lymphadenopathy on cervical exam.  No  JVD.  HEENT:  Normocephalic, atraumatic.  Pupils were equal, round, reactive to  light.  Extraocular movements were intact.  She did not have any nasal  drainage on exam of her nose.  LUNGS:  Lungs were clear to auscultation except for mild rhonchi, without  any crackles.  CARDIAC:  Regular rate and rhythm.  No gallops or murmur.  ABDOMEN:  Obese, nontender, soft.  EXTREMITIES:  No edema.  No cyanosis.  CNS:  Alert and oriented x3.  No focal deficits.   LABORATORY AND ACCESSORY CLINICAL DATA:  She was positive for influenza A  test in the ED.  WBC count 9.3, hemoglobin 10.9, hematocrit 33.3, MCV 82.2,  platelet count 300,000.  As mentioned above, she was positive on nasal  smear  for influenza A antigen.   Chest x-ray showed bronchitic changes without any acute infiltrate.   IMPRESSION:  Influenza.  The patient is more than 30 hours past initiation  of her symptoms and she is in the first trimester of her pregnancy -- a time  when her fetus would be most vulnerable to iatrogenic side-effects.  No  trials have been performed to assess the safety  of antiviral drugs during  pregnancy and teratogenicity and embryotoxicity and rest have not  demonstrated some of this antiviral medications, therefore, we will not use  antiviral medicines; the risks outweigh the benefit in this lady.  We will  treat her supportively and symptomatically with Tylenol and other medicines  for now.  She will be in a private room and respiratory isolation will be  instituted.  The patient apparently had some mild spotting earlier and would  need a gynecologic evaluation while she is in the hospital.  Currently, she  is  hemodynamically stable, does not have any acute  bleeding per vaginum.      GO/MEDQ  D:  04/23/2004  T:  04/23/2004  Job:  [id number]

## 2010-08-22 NOTE — Consult Note (Signed)
NAMELILLYTH, SPONG           ACCOUNT NO.:  [id number]   MEDICAL RECORD NO.:  [id number]          PATIENT TYPE:  INP   LOCATION:  9320                          [facility]ACILITY:  [facility]   PHYSICIAN:  Sigmund I. Patsi Sears, M.D.DATE OF BIRTH:  04/16/1975   DATE OF CONSULTATION:  11/22/2004  DATE OF DISCHARGE:                                   CONSULTATION   UROLOGICAL CONSULTATION:   CHIEF COMPLAINT:  Perineal pain and probable urethral abscess.   HISTORY OF PRESENT ILLNESS:  Asked to see patient by Dr. Argentina Donovan.  This  is a 35 year old African-American female who after intercourse three days  ago began to have pain and swelling in the perineal area.  She felt a hard  nodule when wiping after toileting and called EMS because of her concerns.  She then presented to the emergency department here at [facility].  She  is 37-1/[redacted] weeks gestation.  She presently is complaining of perineal pain  along with some stinging and burning which is worse with sitting and some  stinging and burning also with urination.  She has had some leaking of blood  and yellowish discharge today.  She denies fever, chills, nausea or vomiting  and she has never had this before.   PAST MEDICAL HISTORY:  Gravida 7 para 4, GERD, depression with pregnancy in  2003.   PAST SURGICAL HISTORY:  D&C and a C-section in 1995.   FAMILY HISTORY:  Noncontributory.   SOCIAL HISTORY:  Married, four children ages 35, 100, 53, and 3.  She presently  works as a Electrical engineer.  No alcohol use.  No illicit drug use.  Some tobacco use.   MEDICATIONS:  1.  Prenatal vitamin p.o. daily.  2.  Ampicillin 500 mg p.o. q.6 h.  3.  Percocet 10/650 one p.o. q.4 h. p.r.n. pain.  4.  Pyridium 200 mg one p.o. q.6 h.  5.  Sitz baths.   ALLERGIES:  None known.   REVIEW OF SYSTEMS:  Review of systems is same as HPI and negative for all  other systems reviewed.   LABORATORIES:  Wet mount positive for a few bacteria.  She has  had a  prenatal panel which is pending, GC and Chlamydia which is pending.   PHYSICAL EXAMINATION:  Temperature 98.3, pulse 91, respirations 16, blood  pressure 118/74, height 66 inches, weight 206 pounds.  This is a well-  appearing 37-1/2 pregnant 35 year old African-American female in no acute  distress.  She is alert and oriented x3, pleasant and cooperative.  Her skin  is normal in color and warm and dry.  Respiratory effort is normal.  Perineal exam reveals a very firm to hard round encapsulated golf ball-sized  abscess retro (beneath) the urethra.  There is a small amount of ecchymoses  with dried yellow discharge on the lateral aspect of the abscess.  There is  no active drainage.  The urethral meatus is normal in appearance without  caruncle, erythema, or discharge.   ASSESSMENT AND PLAN:  Urethral diverticulum with abscess.  Continue  antibiotics, Sitz baths and Pyridium.  We will schedule  cystoscopy with  incision and drainage of the abscess in the morning.  Nothing by mouth after  midnight.  Spoke to Dr. Okey Dupre, the obstetrician, who agrees with plan and  says that the patient should do well with spinal anesthesia.  Dr. Patsi Sears  also in to examine the patient as well and agrees with surgical plan.  We  will obtain culture in the operating room.  We need to get a UA and a CMET  prior to surgery.  CBC is pending.  We will leave Foley in after surgery and  continue communication with obstetrics as to future delivery of her child  via scheduled delivery versus cesarean section, etc., after the incision and  drainage in the morning.     ______________________________  Shirlean Mylar, NP      Sigmund I. Patsi Sears, M.D.  Electronically Signed    KY/MEDQ  D:  11/22/2004  T:  11/22/2004  Job:  [id number]
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
## 2010-11-23 ENCOUNTER — Emergency Department (HOSPITAL_COMMUNITY)
Admission: EM | Admit: 2010-11-23 | Discharge: 2010-11-23 | Disposition: A | Discharge status: Home / Self Care | Payer: Self-pay | Attending: Emergency Medicine | Admitting: Emergency Medicine

## 2010-11-23 DIAGNOSIS — F411 Generalized anxiety disorder: Secondary | ICD-10-CM | POA: Insufficient documentation

## 2010-11-23 DIAGNOSIS — H9209 Otalgia, unspecified ear: Secondary | ICD-10-CM | POA: Insufficient documentation

## 2010-11-23 DIAGNOSIS — B070 Plantar wart: Secondary | ICD-10-CM | POA: Insufficient documentation

## 2010-11-23 DIAGNOSIS — H6690 Otitis media, unspecified, unspecified ear: Secondary | ICD-10-CM | POA: Insufficient documentation

## 2012-06-02 ENCOUNTER — Inpatient Hospital Stay (HOSPITAL_COMMUNITY)
Admission: AD | Admit: 2012-06-02 | Discharge: 2012-06-02 | Disposition: A | Discharge status: Home / Self Care | Payer: Self-pay | Source: Ambulatory Visit | Attending: Obstetrics & Gynecology | Admitting: Obstetrics & Gynecology

## 2012-06-02 ENCOUNTER — Encounter (HOSPITAL_COMMUNITY): Payer: Self-pay | Admitting: *Deleted

## 2012-06-02 DIAGNOSIS — F17200 Nicotine dependence, unspecified, uncomplicated: Secondary | ICD-10-CM | POA: Insufficient documentation

## 2012-06-02 DIAGNOSIS — K219 Gastro-esophageal reflux disease without esophagitis: Secondary | ICD-10-CM

## 2012-06-02 DIAGNOSIS — R1110 Vomiting, unspecified: Secondary | ICD-10-CM | POA: Insufficient documentation

## 2012-06-02 DIAGNOSIS — R109 Unspecified abdominal pain: Secondary | ICD-10-CM | POA: Insufficient documentation

## 2012-06-02 HISTORY — DX: Other specified health status: Z78.9

## 2012-06-02 LAB — URINALYSIS, ROUTINE W REFLEX MICROSCOPIC
Glucose, UA: NEGATIVE mg/dL
Hgb urine dipstick: NEGATIVE
Ketones, ur: NEGATIVE mg/dL
Leukocytes, UA: NEGATIVE
Urobilinogen, UA: 0.2 mg/dL (ref 0.0–1.0)
pH: 6 (ref 5.0–8.0)

## 2012-06-02 LAB — CBC WITH DIFFERENTIAL/PLATELET
Eosinophils Absolute: 0.5 10*3/uL (ref 0.0–0.7)
Lymphocytes Relative: 18 % (ref 12–46)
Lymphs Abs: 2.6 10*3/uL (ref 0.7–4.0)
MCHC: 34.2 g/dL (ref 30.0–36.0)
Monocytes Absolute: 0.8 10*3/uL (ref 0.1–1.0)
Monocytes Relative: 6 % (ref 3–12)
Neutrophils Relative %: 72 % (ref 43–77)
Platelets: 303 10*3/uL (ref 150–400)
RDW: 14 % (ref 11.5–15.5)
WBC: 14.3 10*3/uL — ABNORMAL HIGH (ref 4.0–10.5)

## 2012-06-02 MED ORDER — GI COCKTAIL ~~LOC~~
30.0000 mL | Freq: Once | ORAL | Status: AC
  Administered 2012-06-02: 30 mL via ORAL
  Filled 2012-06-02: qty 30

## 2012-06-02 NOTE — MAU Note (Signed)
Patient states she had sudden onset of nausea and vomiting with some blood in it and abdominal pain. Patient has a specimen cup with some liquid and partially digested food with slight reddish color to partially digested food.

## 2012-06-02 NOTE — MAU Provider Note (Signed)
History     CSN: [id number]  Arrival date and time: 06/02/12 1723   First Provider Initiated Contact with Patient 06/02/12 1816      Chief Complaint  Patient presents with  . Emesis  . Abdominal Pain   HPI Christine Berry is 37 y.o. [id number] Unknown weeks presenting with vomiting blood.  States she nauseated all day and she  was on her to work when she got sick and vomited blood.  Vomited X 6 today.  States she has history of gastric reflux.  Uses baking soda and toothpaste to cool her esophagus.  Had same with pregnancies and feel it worsen after her C-Section.  Is a patient of Dr. Elsie Stain.  She did not call him because she doesn't think he would see her without Medicaid.  She ate toothpaste at 6am and it helped.   States the pain is worse when she smokes.  She brought in blood in a cup, threw it away and offered to make herself gag to reproduce blood.  Told her that was not necessary.   Past Medical History  Diagnosis Date  . Medical history non-contributory     Past Surgical History  Procedure Laterality Date  . Cesarean section      History reviewed. No pertinent family history.  History  Substance Use Topics  . Smoking status: Current Every Day Smoker -- 0.50 packs/day  . Smokeless tobacco: Not on file  . Alcohol Use: 1.8 oz/week    3 Cans of beer per week    Allergies: No Known Allergies  No prescriptions prior to admission    Review of Systems  Constitutional: Negative.   HENT: Negative.   Cardiovascular: Negative.   Gastrointestinal: Positive for nausea, vomiting (blood X 1) and abdominal pain (epigastric). Negative for diarrhea and constipation.  Genitourinary: Negative.    Physical Exam   Blood pressure 133/82, pulse 85, temperature 98 F (36.7 C), temperature source Oral, resp. rate 16, height 5\' 8"  (1.727 m), weight 209 lb (94.802 kg), last menstrual period 05/09/2012, SpO2 100.00%.  Physical Exam  Constitutional: She appears well-developed  and well-nourished. No distress.  HENT:  Head: Normocephalic.  Neck: Normal range of motion.  Cardiovascular: Normal rate.   Respiratory: Effort normal.  GI: Soft. She exhibits no distension and no mass. There is tenderness (epigastric ). There is no rebound and no guarding.  Genitourinary:  Not indicated  Neurological: She is alert.  Skin: Skin is warm and dry.  Psychiatric: She has a normal mood and affect. Her behavior is normal.   Results for orders placed during the hospital encounter of 06/02/12 (from the past 24 hour(s))  URINALYSIS, ROUTINE W REFLEX MICROSCOPIC     Status: Abnormal   Collection Time    06/02/12  5:40 PM      Result Value Range   Color, Urine YELLOW  YELLOW   APPearance CLEAR  CLEAR   Specific Gravity, Urine <1.005 (*) 1.005 - 1.030   pH 6.0  5.0 - 8.0   Glucose, UA NEGATIVE  NEGATIVE mg/dL   Hgb urine dipstick NEGATIVE  NEGATIVE   Bilirubin Urine NEGATIVE  NEGATIVE   Ketones, ur NEGATIVE  NEGATIVE mg/dL   Protein, ur NEGATIVE  NEGATIVE mg/dL   Urobilinogen, UA 0.2  0.0 - 1.0 mg/dL   Nitrite NEGATIVE  NEGATIVE   Leukocytes, UA NEGATIVE  NEGATIVE  POCT PREGNANCY, URINE     Status: None   Collection Time    06/02/12  6:15 PM  Result Value Range   Preg Test, Ur NEGATIVE  NEGATIVE   MAU Course  Procedures  MDM GI cocktail ordered 19:00 Care turned over to Pamelia Hoit, NP  Assessment and Plan  A:  Vomited blood X 1     History of GERD     SMOKER  P:Stressed importance of discontinuing smoking--irritant   Discontinue use of toothpaste and baking soda    Patient needs work note.  KEY,EVE M 06/02/2012, 6:16 PM

## 2012-06-03 NOTE — MAU Provider Note (Signed)
Attestation of Attending Supervision of Advanced Practitioner (CNM/NP): Evaluation and management procedures were performed by the Advanced Practitioner under my supervision and collaboration. I have reviewed the Advanced Practitioner's note and chart, and I agree with the management and plan.  Hollie Wojahn H. 6:55 AM   

## 2013-10-25 ENCOUNTER — Encounter (HOSPITAL_COMMUNITY): Payer: Self-pay | Admitting: Emergency Medicine

## 2013-10-25 ENCOUNTER — Emergency Department (HOSPITAL_COMMUNITY)
Admission: EM | Admit: 2013-10-25 | Discharge: 2013-10-25 | Disposition: A | Discharge status: Home / Self Care | Payer: No Typology Code available for payment source | Attending: Emergency Medicine | Admitting: Emergency Medicine

## 2013-10-25 DIAGNOSIS — S40859A Superficial foreign body of unspecified upper arm, initial encounter: Principal | ICD-10-CM

## 2013-10-25 DIAGNOSIS — Y929 Unspecified place or not applicable: Secondary | ICD-10-CM | POA: Insufficient documentation

## 2013-10-25 DIAGNOSIS — S40259A Superficial foreign body of unspecified shoulder, initial encounter: Secondary | ICD-10-CM | POA: Insufficient documentation

## 2013-10-25 DIAGNOSIS — T148XXA Other injury of unspecified body region, initial encounter: Secondary | ICD-10-CM

## 2013-10-25 DIAGNOSIS — Z23 Encounter for immunization: Secondary | ICD-10-CM | POA: Insufficient documentation

## 2013-10-25 DIAGNOSIS — Y9311 Activity, swimming: Secondary | ICD-10-CM | POA: Insufficient documentation

## 2013-10-25 DIAGNOSIS — F17200 Nicotine dependence, unspecified, uncomplicated: Secondary | ICD-10-CM | POA: Insufficient documentation

## 2013-10-25 DIAGNOSIS — X58XXXA Exposure to other specified factors, initial encounter: Secondary | ICD-10-CM | POA: Insufficient documentation

## 2013-10-25 MED ORDER — TETANUS-DIPHTH-ACELL PERTUSSIS 5-2.5-18.5 LF-MCG/0.5 IM SUSP
0.5000 mL | Freq: Once | INTRAMUSCULAR | Status: AC
  Administered 2013-10-25: 0.5 mL via INTRAMUSCULAR
  Filled 2013-10-25: qty 0.5

## 2013-10-25 MED ORDER — CEPHALEXIN 500 MG PO CAPS: 500.0000 mg | ORAL_CAPSULE | Freq: Four times a day (QID) | ORAL | Status: DC

## 2013-10-25 NOTE — Discharge Instructions (Signed)
Fish Hook Removal °A fish hook can cause a small cut or lesion that extends through all layers of the skin and into the subcutaneous tissue. Because of this, bacteria may get injected beneath the surface of the skin. A simple bandage (dressing) may be applied. This should be changed daily. Follow your caregiver's instructions regarding use of any antibacterial ointments.  °Only take over-the-counter or prescription medicines for pain, discomfort, or fever as directed by your caregiver. °If you did not receive a tetanus shot from your caregiver because you did not recall when your last one was given, make sure to check with your physician's office and determine if one is needed. Generally for a "dirty" wound, you should receive a tetanus booster if you have not had one in the last five years. If you have a "clean" wound, you should receive a tetanus booster if you have not had one within the last ten years. °SEEK IMMEDIATE MEDICAL CARE IF:  °· You develop redness, swelling, or increasing pain in the wound. °· You have a fever. °· You notice a bad smell coming from the wound or dressing. °· You notice pus or other unusual drainage coming from the wound. °Document Released: 03/20/2000 Document Revised: 06/15/2011 Document Reviewed: 10/11/2008 °ExitCare® Patient Information ©2015 [employer]. This information is not intended to replace advice given to you by your health care provider. Make sure you discuss any questions you have with your health care provider. ° ° °

## 2013-10-25 NOTE — ED Notes (Signed)
Patient was out fishing with son and the fish hook got caught in her left arm. Patient states she tried removing the fish hook at home but the pain was bad.

## 2013-10-25 NOTE — ED Provider Notes (Signed)
CSN: [id number]     Arrival date & time 10/25/13  1854 History  This chart was scribed for Elpidio AnisShari Carlon Davidson, PA, working with Toy BakerAnthony T Allen, MD by Chestine SporeSoijett Blue, ED Scribe. The patient was seen in room WTR5/WTR5 at 7:13 PM.     No chief complaint on file.    The history is provided by the patient. No language interpreter was used.   HPI Comments: Christine Berry is a 38 y.o. female who presents to the Emergency Department complaining of a fish hook in the left arm onset today. [place] hook got lodged in her arm. She states that this has never happened to her before. She denies any other associated symptoms. She states that her last Tetanus shot was when she was a little girl. She states that she is not allergic to any medications. She states that she does not have a PCP.   Past Medical History  Diagnosis Date  . Medical history non-contributory    Past Surgical History  Procedure Laterality Date  . Cesarean section     No family history on file. History  Substance Use Topics  . Smoking status: Current Every Day Smoker -- 0.50 packs/day  . Smokeless tobacco: Not on file  . Alcohol Use: 1.8 oz/week    3 Cans of beer per week   OB History   Grav Para Term Preterm Abortions TAB SAB Ect Mult Living   13 6   7 7    6      Review of Systems  Skin: Positive for wound (fish hook in upper left arm).  All other systems reviewed and are negative.    Allergies  Review of patient's allergies indicates no known allergies.  Home Medications   Prior to Admission medications   Medication Sig Start Date End Date Taking? Authorizing Provider  Multiple Vitamin (MULTIVITAMIN WITH MINERALS) TABS Take 1 tablet by mouth daily.    Historical Provider, MD   BP 143/76  Pulse 86  Temp(Src) 98.7 F (37.1 C) (Oral)  Resp 18  SpO2 100%  Physical Exam  Nursing note and vitals reviewed. Constitutional: She is oriented to person, place,  and time. She appears well-developed and well-nourished. No distress.  HENT:  Head: Normocephalic and atraumatic.  Eyes: EOM are normal.  Neck: Neck supple. No tracheal deviation present.  Cardiovascular: Normal rate.   Pulmonary/Chest: Effort normal. No respiratory distress.  Musculoskeletal: Normal range of motion.  Neurological: She is alert and oriented to person, place, and time.  Skin: Skin is warm and dry.  Fish hook embedded in posterior upper left arm.  Psychiatric: She has a normal mood and affect. Her behavior is normal.    ED Course  Procedures (including critical care time) DIAGNOSTIC STUDIES: Oxygen Saturation is 100% on room air, normal by my interpretation.    COORDINATION OF CARE: 7:26 PM-Discussed treatment plan which includes Preventive Antibiotics, updated Tetanus shot, and pain management with pt at bedside and pt agreed to plan.   Labs Review Labs Reviewed - No data to display  Imaging Review No results found.   EKG Interpretation None     Area to left upper posterior arm numbed with 2% plain lidocaine. Fish hook pushed through to expose end with barb, which was cut with wire cutters. The remainder of hook removed with some resistance.  MDM   Final diagnoses:  None    1. Foreign body in skin  Will treat with antibiotics to prevent infection. Tetanus updated. Care instructions given.  I personally performed the services described in this documentation, which was scribed in my presence. The recorded information has been reviewed and is accurate.    Arnoldo Hooker, PA-C 10/25/13 1942

## 2013-10-26 NOTE — ED Provider Notes (Signed)
Medical screening examination/treatment/procedure(s) were performed by non-physician practitioner and as supervising physician I was immediately available for consultation/collaboration.  Dawit Tankard T Sameka Bagent, MD 10/26/13 2141 

## 2013-12-18 ENCOUNTER — Inpatient Hospital Stay (HOSPITAL_COMMUNITY)
Admission: AD | Admit: 2013-12-18 | Discharge: 2013-12-18 | Disposition: A | Discharge status: Home / Self Care | Payer: No Typology Code available for payment source | Source: Ambulatory Visit | Attending: Obstetrics & Gynecology | Admitting: Obstetrics & Gynecology

## 2013-12-18 DIAGNOSIS — Z3200 Encounter for pregnancy test, result unknown: Secondary | ICD-10-CM | POA: Diagnosis present

## 2013-12-18 DIAGNOSIS — Z3202 Encounter for pregnancy test, result negative: Secondary | ICD-10-CM

## 2013-12-18 LAB — URINE MICROSCOPIC-ADD ON

## 2013-12-18 LAB — URINALYSIS, ROUTINE W REFLEX MICROSCOPIC
Bilirubin Urine: NEGATIVE
GLUCOSE, UA: NEGATIVE mg/dL
Ketones, ur: NEGATIVE mg/dL
Leukocytes, UA: NEGATIVE
Nitrite: NEGATIVE
PH: 5.5 (ref 5.0–8.0)
PROTEIN: NEGATIVE mg/dL
SPECIFIC GRAVITY, URINE: 1.025 (ref 1.005–1.030)
Urobilinogen, UA: 0.2 mg/dL (ref 0.0–1.0)

## 2013-12-18 LAB — POCT PREGNANCY, URINE: PREG TEST UR: NEGATIVE

## 2013-12-18 NOTE — MAU Provider Note (Signed)
Attestation of Attending Supervision of Advanced Practitioner (PA/CNM/NP): Evaluation and management procedures were performed by the Advanced Practitioner under my supervision and collaboration.  I have reviewed the Advanced Practitioner's note and chart, and I agree with the management and plan.  Stefany Starace, MD, FACOG Attending Obstetrician & Gynecologist Faculty Practice, [facility]   

## 2013-12-18 NOTE — MAU Note (Signed)
Pt reports lower abd pain, LMP 12/18/2013

## 2013-12-18 NOTE — MAU Provider Note (Signed)
Christine Berry is a 38 y.o. [id number] who presents to MAU today for a pregnancy test. The patient states LMP today. She is concerned because she has had a "period" when she was pregnant before. She denies any other complaints today.   BP 136/79  Pulse 73  Temp(Src) 98.3 F (36.8 C) (Oral)  Resp 18  Ht  (1.676 m)  Wt 207 lb (93.895 kg)  BMI 33.43 kg/m2  SpO2 100%  LMP 12/18/2013 GENERAL: Well-developed, well-nourished female in no acute distress.  HEENT: Normocephalic, atraumatic.   LUNGS: Effort normal HEART: Regular rate  SKIN: Warm, dry and without erythema PSYCH: Normal mood and affect  Results for orders placed during the hospital encounter of 12/18/13 (from the past 24 hour(s))  URINALYSIS, ROUTINE W REFLEX MICROSCOPIC     Status: Abnormal   Collection Time    12/18/13  9:27 PM      Result Value Ref Range   Color, Urine YELLOW  YELLOW   APPearance CLEAR  CLEAR   Specific Gravity, Urine 1.025  1.005 - 1.030   pH 5.5  5.0 - 8.0   Glucose, UA NEGATIVE  NEGATIVE mg/dL   Hgb urine dipstick LARGE (*) NEGATIVE   Bilirubin Urine NEGATIVE  NEGATIVE   Ketones, ur NEGATIVE  NEGATIVE mg/dL   Protein, ur NEGATIVE  NEGATIVE mg/dL   Urobilinogen, UA 0.2  0.0 - 1.0 mg/dL   Nitrite NEGATIVE  NEGATIVE   Leukocytes, UA NEGATIVE  NEGATIVE  URINE MICROSCOPIC-ADD ON     Status: None   Collection Time    12/18/13  9:27 PM      Result Value Ref Range   Squamous Epithelial / LPF RARE  RARE   WBC, UA 0-2  <3 WBC/hpf   RBC / HPF 0-2  <3 RBC/hpf   Bacteria, UA RARE  RARE  POCT PREGNANCY, URINE     Status: None   Collection Time    12/18/13  9:52 PM      Result Value Ref Range   Preg Test, Ur NEGATIVE  NEGATIVE    A: Negative pregnancy test  P: Discharge home Patient may return to MAU as needed or if her condition were to change or worsen  Marny Lowenstein, PA-C  12/18/2013 10:43 PM

## 2013-12-18 NOTE — Discharge Instructions (Signed)
Pregnancy Tests °HOW DO PREGNANCY TESTS WORK? °All pregnancy tests look for a special hormone in the urine or blood that is only present in pregnant women. This hormone, human chorionic gonadotropin (hCG), is also called the pregnancy hormone.  °WHAT IS THE DIFFERENCE BETWEEN A URINE AND A BLOOD PREGNANCY TEST? IS ONE BETTER THAN THE OTHER? °There are two types of pregnancy tests. °· Blood tests. °· Urine tests. °Both tests look for the presence of hCG, the pregnancy hormone. Many women use a urine test or home pregnancy test (HPT) to find out if they are pregnant. HPTs are cheap, easy to use, can be done at home, and are private. When a woman has a positive result on an HPT, she needs to see her caregiver right away. The caregiver can confirm a positive HPT result with another urine test, a blood test, ultrasound, and a pelvic exam.  °There are two types of blood tests you can get from a caregiver.  °· A quantitative blood test (or the beta hCG test). This test measures the exact amount of hCG in the blood. This means it can pick up very small amounts of hCG, making it a very accurate test. °· A qualitative hCG blood test. This test gives a simple yes or no answer to whether you are pregnant. This test is more like a urine test in terms of its accuracy. °Blood tests can pick up hCG earlier in a pregnancy than urine tests can. Blood tests can tell if you are pregnant about 6 to 8 days after you release an egg from an ovary (ovulate). Urine tests can determine pregnancy about 2 weeks after ovulation.  °HOW IS A HOME PREGNANCY TEST DONE?  °There are many types of home pregnancy tests or HPTs that can be bought over-the-counter at drug or discount stores.  °· Some involve collecting your urine in a cup and dipping a stick into the urine or putting some of the urine into a special container with an eyedropper. °· Others are done by placing a stick into your urine stream. °· Tests vary in how long you need to wait for  the stick or container to turn a certain color or have a symbol on it (like a plus or a minus). °· All tests come with written instructions. Most tests also have toll-free phone numbers to call if you have any questions about how to do the test or read the results. °HOW ACCURATE ARE HOME PREGNANCY TESTS?  °HPTs are very accurate. Most brands of HPTs say they are 97% to 99% accurate when taken 1 week after missing your menstrual period, but this can vary with actual use. Each brand varies in how sensitive it is in picking up the pregnancy hormone hCG. If a test is not done correctly, it will be less accurate. Always check the package to make sure it is not past its expiration date. If it is, it will not be accurate. Most brands of HPTs tell users to do the test again in a few days, no matter what the results.  °If you use an HPT too early in your pregnancy, you may not have enough of the pregnancy hormone hCG in your urine to have a positive test result. Most HPTs will be accurate if you test yourself around the time your period is due (about 2 weeks after you ovulate). You can get a negative test result if you are not pregnant or if you ovulated later than you thought you did.   You may also have problems with the pregnancy, which affects the amount of hCG you have in your urine. If your HPT is negative, test yourself again within a few days to 1 week. If you keep getting a negative result and think you are pregnant, talk with your caregiver right away about getting a blood pregnancy test.  °FALSE POSITIVE PREGNANCY TEST °A false positive HPT can happen if there is blood or protein present in your urine. A false positive can also happen if you were recently pregnant or if you take a pregnancy test too soon after taking fertility drug that contains hCG. Also, some prescription medicines such as water pills (diuretics), tranquilizers, seizure medicines, psychiatric medicines, and allergy and nausea medicines  (promethazine) give false positive readings. °FALSE NEGATIVE PREGNANCY TEST °· A false negative HPT can happen if you do the test too early. Try to wait until you are at least 1 day late for your menstrual period. °· It may happen if you wait too long to test the urine (longer than 15 minutes). °· It may also happen if the urine is too diluted because you drank a lot of fluids before getting the urine sample. It is best to test the first morning urine after you get out of bed. °If your menstrual period did not start after a week of a negative HPT, repeat the pregnancy test. °CAN ANYTHING INTERFERE WITH HOME PREGNANCY TEST RESULTS?  °Most medicines, both over-the-counter and prescription drugs, including birth control pills and antibiotics, should not affect the results of a HPT. Only those drugs that have the pregnancy hormone hCG in them can give a false positive test result. Drugs that have hCG in them may be used for treating infertility (not being able to get pregnant). Alcohol and illegal drugs do not affect HPT results, but you should not be using these substances if you are trying to get pregnant. If you have a positive pregnancy test, call your caregiver to make an appointment to begin prenatal care. °Document Released: 03/26/2003 Document Revised: 06/15/2011 Document Reviewed: 07/07/2013 °ExitCare® Patient Information ©2015 [employer]. This information is not intended to replace advice given to you by your health care provider. Make sure you discuss any questions you have with your health care provider. ° °

## 2014-02-05 ENCOUNTER — Encounter (HOSPITAL_COMMUNITY): Payer: Self-pay | Admitting: Emergency Medicine

## 2014-10-02 ENCOUNTER — Emergency Department (HOSPITAL_COMMUNITY)
Admission: EM | Admit: 2014-10-02 | Discharge: 2014-10-03 | Disposition: A | Discharge status: Home / Self Care | Payer: No Typology Code available for payment source | Attending: Emergency Medicine | Admitting: Emergency Medicine

## 2014-10-02 DIAGNOSIS — K088 Other specified disorders of teeth and supporting structures: Secondary | ICD-10-CM | POA: Diagnosis present

## 2014-10-02 DIAGNOSIS — K047 Periapical abscess without sinus: Secondary | ICD-10-CM | POA: Insufficient documentation

## 2014-10-02 DIAGNOSIS — Z720 Tobacco use: Secondary | ICD-10-CM | POA: Insufficient documentation

## 2014-10-02 NOTE — ED Notes (Signed)
Pt states that she has a decayed tooth and can't afford to get it pulled so she has been trying to pull it out herself and she feels like it is infected now. Alert and oriented.

## 2014-10-03 MED ORDER — PENICILLIN V POTASSIUM 500 MG PO TABS: 500.0000 mg | ORAL_TABLET | Freq: Four times a day (QID) | ORAL | Status: AC

## 2014-10-03 MED ORDER — IBUPROFEN 800 MG PO TABS
800.0000 mg | ORAL_TABLET | Freq: Once | ORAL | Status: AC
  Administered 2014-10-03: 800 mg via ORAL
  Filled 2014-10-03: qty 1

## 2014-10-03 MED ORDER — IBUPROFEN 800 MG PO TABS: 800.0000 mg | ORAL_TABLET | Freq: Four times a day (QID) | ORAL | Status: DC | PRN

## 2014-10-03 MED ORDER — PENICILLIN V POTASSIUM 500 MG PO TABS
500.0000 mg | ORAL_TABLET | Freq: Once | ORAL | Status: AC
  Administered 2014-10-03: 500 mg via ORAL
  Filled 2014-10-03: qty 1

## 2014-10-03 NOTE — Discharge Instructions (Signed)
Abscessed Tooth An abscessed tooth is an infection around your tooth. It may be caused by holes or damage to the tooth (cavity) or a dental disease. An abscessed tooth causes mild to very bad pain in and around the tooth. See your dentist right away if you have tooth or gum pain. HOME CARE  Take your medicine as told. Finish it even if you start to feel better.  Do not drive after taking pain medicine.  Rinse your mouth (gargle) often with salt water ( teaspoon salt in 8 ounces of warm water).  Do not apply heat to the outside of your face. GET HELP RIGHT AWAY IF:   You have a temperature by mouth above 102 F (38.9 C), not controlled by medicine.  You have chills and a very bad headache.  You have problems breathing or swallowing.  Your mouth will not open.  You develop puffiness (swelling) on the neck or around the eye.  Your pain is not helped by medicine.  Your pain is getting worse instead of better. MAKE SURE YOU:   Understand these instructions.  Will watch your condition.  Will get help right away if you are not doing well or get worse. Document Released: 09/09/2007 Document Revised: 06/15/2011 Document Reviewed: 07/01/2010 [facility] Patient Information 2015 [place], [place]. This information is not intended to replace advice given to you by your health care provider. Make sure you discuss any questions you have with your health care provider. Even for referred to the dentist, please call first thing morning telling then referred to the emergency department.  They will make every effort to see you within the next 24 hours.  U been given prescriptions for pain medication, as well as and aquatic please take these as directed until completed

## 2014-10-03 NOTE — ED Provider Notes (Addendum)
CSN: [id number]     Arrival date & time 10/02/14  2307 History   First MD Initiated Contact with Patient 10/03/14 0029     Chief Complaint  Patient presents with  . Dental Pain     (Consider location/radiation/quality/duration/timing/severity/associated sxs/prior Treatment) HPI Comments: Dental pain for a while no treatments at home  The history is provided by the patient.    Past Medical History  Diagnosis Date  . Medical history non-contributory    Past Surgical History  Procedure Laterality Date  . Cesarean section     No family history on file. History  Substance Use Topics  . Smoking status: Current Every Day Smoker -- 0.50 packs/day  . Smokeless tobacco: Not on file  . Alcohol Use: 1.8 oz/week    3 Cans of beer per week   OB History    Gravida Para Term Preterm AB TAB SAB Ectopic Multiple Living   13 6   7 7    6      Review of Systems  HENT: Positive for dental problem.   Neurological: Negative for dizziness and headaches.  All other systems reviewed and are negative.     Allergies  Review of patient's allergies indicates no known allergies.  Home Medications   Prior to Admission medications   Medication Sig Start Date End Date Taking? Authorizing Provider  ibuprofen (ADVIL,MOTRIN) 800 MG tablet Take 1 tablet (800 mg total) by mouth every 6 (six) hours as needed. 10/03/14   Earley FavorGail Ezrie Bunyan, NP  Multiple Vitamin (MULTIVITAMIN WITH MINERALS) TABS Take 1 tablet by mouth daily.    Historical Provider, MD  penicillin v potassium (VEETID) 500 MG tablet Take 1 tablet (500 mg total) by mouth 4 (four) times daily. 10/03/14   Earley FavorGail Viriginia Amendola, NP   BP 160/97 mmHg  Pulse 87  Temp(Src) 98.6 F (37 C) (Oral)  Resp 26  SpO2 100%  LMP 09/17/2014 Physical Exam  Constitutional: She appears well-developed.  HENT:  Head: Normocephalic.  Mouth/Throat: Uvula is midline.    Eyes: Pupils are equal, round, and reactive to light.  Neck: Normal range of motion.   Cardiovascular: Normal rate.   Pulmonary/Chest: Effort normal.  Musculoskeletal: Normal range of motion.  Neurological: She is alert.  Nursing note and vitals reviewed.   ED Course  Procedures (including critical care time) Labs Review Labs Reviewed - No data to display  Imaging Review No results found.   EKG Interpretation None      MDM   Final diagnoses:  Dental abscess         Earley FavorGail Alexey Rhoads, NP 10/03/14 0113  Loren Raceravid Yelverton, MD 10/03/14 2324  Earley FavorGail Lillianne Eick, NP 10/12/14 [id number]  Loren Raceravid Yelverton, MD 10/13/14 [phone]

## 2015-04-12 ENCOUNTER — Ambulatory Visit (INDEPENDENT_AMBULATORY_CARE_PROVIDER_SITE_OTHER): Payer: BLUE CROSS/BLUE SHIELD | Admitting: Family Medicine

## 2015-04-12 VITALS — BP 104/72 | HR 122 | Temp 98.5°F | Resp 16 | Ht 66.0 in | Wt 210.4 lb

## 2015-04-12 DIAGNOSIS — F17200 Nicotine dependence, unspecified, uncomplicated: Secondary | ICD-10-CM | POA: Diagnosis not present

## 2015-04-12 DIAGNOSIS — F411 Generalized anxiety disorder: Secondary | ICD-10-CM

## 2015-04-12 DIAGNOSIS — F329 Major depressive disorder, single episode, unspecified: Secondary | ICD-10-CM

## 2015-04-12 DIAGNOSIS — E669 Obesity, unspecified: Secondary | ICD-10-CM

## 2015-04-12 DIAGNOSIS — L309 Dermatitis, unspecified: Secondary | ICD-10-CM | POA: Diagnosis not present

## 2015-04-12 DIAGNOSIS — F32A Depression, unspecified: Secondary | ICD-10-CM

## 2015-04-12 DIAGNOSIS — R252 Cramp and spasm: Secondary | ICD-10-CM | POA: Diagnosis not present

## 2015-04-12 MED ORDER — TRIAMCINOLONE ACETONIDE 0.1 % EX CREA: 1.0000 "application " | TOPICAL_CREAM | Freq: Two times a day (BID) | CUTANEOUS | Status: AC

## 2015-04-12 MED ORDER — BACLOFEN 10 MG PO TABS: 10.0000 mg | ORAL_TABLET | Freq: Three times a day (TID) | ORAL | Status: DC

## 2015-04-12 MED ORDER — SERTRALINE HCL 50 MG PO TABS: 50.0000 mg | ORAL_TABLET | Freq: Every day | ORAL | Status: DC

## 2015-04-12 NOTE — Progress Notes (Signed)
Patient ID: Christine Berry, female    DOB: 12/28/1975  Age: 40 y.o. MRN: [id number]  Chief Complaint  Patient presents with  . muscle spasms    in her foot and her hands  . New Rx    For anxiety  . Medication Refill    baclofen,  zoloft, would like higher doseage for both medication    Subjective:   Patient is here for a number of things. She's got a rash on her hands on the knuckles. It primarily is come on the last 3 weeks of cold weather. It itches. She also has had more problems with her anxiety and depression. She said the 25 mg of Zoloft helped her, but she thinks she needs more. She has a chronic problem cramping in her hands and feet. She takes some baclofen she was prescribed elsewhere and that helps her a lot and she would like a refill on it. Doesn't sound like any major workup was ever done for her. She has a problem with her weight she is concerned about. She smokes and realizes that is not good for her.  Current allergies, medications, problem list, past/family and social histories reviewed.  Objective:  BP 104/72 mmHg  Pulse 122  Temp(Src) 98.5 F (36.9 C) (Oral)  Resp 16  Ht 5\' 6"  (1.676 m)  Wt 210 lb 6.4 oz (95.437 kg)  BMI 33.98 kg/m2  SpO2 98%  LMP 04/05/2015  No major acute distress. Obese lady. Smells of tobacco. Alert and oriented. She talks a lot and asked many questions.. Chest clear. Heart regular without murmurs. Hands appear normal. Pulse is good.  Assessment & Plan:   Assessment: 1. Anxiety state   2. Muscle cramps   3. Eczema   4. Depression   5. Obesity   6. Tobacco use disorder       Plan: Long discussion about weight. Long discussion about stopping smoking. Advised that there are not quick fixes, that she needs to make up her mind and do some lifestyle changes. Did not prescribe her any Xanax that she was requesting. Did increase the sertraline. She was in a hurry to pick up her child so is going to come back and get blood drawn for the  chemistries that I want to check because of spasm hands and feet and depression.  Orders Placed This Encounter  Procedures  . Comprehensive metabolic panel    Standing Status: Future     Number of Occurrences:      Standing Expiration Date: 05/06/2016    Order Specific Question:  Has the patient fasted?    Answer:  No  . TSH    Standing Status: Future     Number of Occurrences:      Standing Expiration Date: 05/07/2015    Meds ordered this encounter  Medications  . DISCONTD: sertraline (ZOLOFT) 25 MG tablet    Sig: Take 25 mg by mouth daily.  Marland Kitchen. DISCONTD: baclofen (LIORESAL) 10 MG tablet    Sig: Take 10 mg by mouth 3 (three) times daily.  . baclofen (LIORESAL) 10 MG tablet    Sig: Take 1 tablet (10 mg total) by mouth 3 (three) times daily.    Dispense:  90 each    Refill:  1  . sertraline (ZOLOFT) 50 MG tablet    Sig: Take 1 tablet (50 mg total) by mouth daily.    Dispense:  30 tablet    Refill:  5  . triamcinolone cream (KENALOG) 0.1 %  Sig: Apply 1 application topically 2 (two) times daily.    Dispense:  30 g    Refill:  0         Patient Instructions  Take the baclofen 10 mg maximum of 3 times daily for muscle cramps, but I would prefer few to use it primarily just on an as-needed basis  Increase the sertraline (Zoloft) 250 mg 1 daily  Work on eating less to try and lose weight  Or getting regular exercise, try for 30 minutes 5 days a week  Taper the cigarettes toward a quit date and at that point quit the last 45 cigarettes as we discussed  Return in about 3 months to follow-up on the new dose of your Zoloft.  Return at anytime if needed     Return in about 3 months (around 07/11/2015).   Denvil Canning, MD 04/12/2015

## 2015-04-12 NOTE — Patient Instructions (Signed)
Take the baclofen 10 mg maximum of 3 times daily for muscle cramps, but I would prefer few to use it primarily just on an as-needed basis  Increase the sertraline (Zoloft) 250 mg 1 daily  Work on eating less to try and lose weight  Or getting regular exercise, try for 30 minutes 5 days a week  Taper the cigarettes toward a quit date and at that point quit the last 45 cigarettes as we discussed  Return in about 3 months to follow-up on the new dose of your Zoloft.  Return at anytime if needed

## 2015-04-23 ENCOUNTER — Inpatient Hospital Stay (HOSPITAL_COMMUNITY)
Admission: AD | Admit: 2015-04-23 | Discharge: 2015-04-23 | Disposition: A | Discharge status: Home / Self Care | Payer: Self-pay | Source: Ambulatory Visit | Attending: Family Medicine | Admitting: Family Medicine

## 2015-04-23 ENCOUNTER — Encounter (HOSPITAL_COMMUNITY): Payer: Self-pay | Admitting: *Deleted

## 2015-04-23 DIAGNOSIS — F17210 Nicotine dependence, cigarettes, uncomplicated: Secondary | ICD-10-CM | POA: Insufficient documentation

## 2015-04-23 DIAGNOSIS — N939 Abnormal uterine and vaginal bleeding, unspecified: Secondary | ICD-10-CM | POA: Insufficient documentation

## 2015-04-23 HISTORY — DX: Other psychoactive substance abuse, uncomplicated: F19.10

## 2015-04-23 LAB — URINE MICROSCOPIC-ADD ON

## 2015-04-23 LAB — CBC
HCT: 37.1 % (ref 36.0–46.0)
Hemoglobin: 12.4 g/dL (ref 12.0–15.0)
MCH: 29.9 pg (ref 26.0–34.0)
MCHC: 33.4 g/dL (ref 30.0–36.0)
MCV: 89.4 fL (ref 78.0–100.0)
Platelets: 290 10*3/uL (ref 150–400)
RBC: 4.15 MIL/uL (ref 3.87–5.11)
RDW: 13.7 % (ref 11.5–15.5)
WBC: 9.6 10*3/uL (ref 4.0–10.5)

## 2015-04-23 LAB — URINALYSIS, ROUTINE W REFLEX MICROSCOPIC
Bilirubin Urine: NEGATIVE
GLUCOSE, UA: NEGATIVE mg/dL
KETONES UR: NEGATIVE mg/dL
Leukocytes, UA: NEGATIVE
Nitrite: NEGATIVE
Protein, ur: NEGATIVE mg/dL
SPECIFIC GRAVITY, URINE: 1.025 (ref 1.005–1.030)
pH: 6 (ref 5.0–8.0)

## 2015-04-23 LAB — WET PREP, GENITAL
CLUE CELLS WET PREP: NONE SEEN
Sperm: NONE SEEN
Trich, Wet Prep: NONE SEEN
YEAST WET PREP: NONE SEEN

## 2015-04-23 LAB — HCG, SERUM, QUALITATIVE: Preg, Serum: NEGATIVE

## 2015-04-23 LAB — POCT PREGNANCY, URINE: Preg Test, Ur: NEGATIVE

## 2015-04-23 NOTE — Discharge Instructions (Signed)

## 2015-04-23 NOTE — MAU Note (Signed)
Pt states she had period on 12/29, started bleeding again on 1/14, is still bleeding now.  Had intercourse with a condom recently, thinks it may have broke.  Doesn't feel very well, unsure of what's going on.

## 2015-04-23 NOTE — MAU Provider Note (Signed)
History     CSN: [id number]  Arrival date and time: 04/23/15 1157   First Provider Initiated Contact with Patient 04/23/15 1402      Chief Complaint  Patient presents with  . Vaginal Bleeding   HPIpt is not pregnant [id number] who presents with concern of onset of bleeding 2 weeks after previous menses. Pt normally has RCM monthly.   Pt is also concerned that she had IC last week with new partner when she was inebriated and not sure what happened.  Pt is not using anything for contraception and concerned she may have gotten pregnant.  Pt is also concerned about STDs.  Pt has hx of substance abuse and has been clean until recently when under stress. I saw patient at 1:30 pm and pt told me she had to leave at 2 pm.  Pt has multiple questions and concerns, but most importantly she wants to know if she is pregnant and if she has STDs.  Pt does not have a PCP, although pt states Dr. Gaynell Face delivered her children and would be willing to return to him.  Pt has questions about contraception. Pt states she has recently seen a counselor and has just started on antidepressant.   Pt has some mild lower abdominal cramping- denies UTI sx, constipation, diarrhea or abnormal vaginal discharge.   RN note: RN Social research officer, government)     Expand All Collapse All   Pt states she had period on 12/29, started bleeding again on 1/14, is still bleeding now. Had intercourse with a condom recently, thinks it may have broke. Doesn't feel very well, unsure of what's going on.           Past Medical History  Diagnosis Date  . Medical history non-contributory   . Anxiety   . Drug abuse     Past Surgical History  Procedure Laterality Date  . Cesarean section      History reviewed. No pertinent family history.  Social History  Substance Use Topics  . Smoking status: Current Every Day Smoker -- 0.50 packs/day  . Smokeless tobacco: None  . Alcohol Use: No    Allergies: No Known  Allergies  Prescriptions prior to admission  Medication Sig Dispense Refill Last Dose  . baclofen (LIORESAL) 10 MG tablet Take 1 tablet (10 mg total) by mouth 3 (three) times daily. (Patient taking differently: Take 10 mg by mouth 3 (three) times daily as needed for muscle spasms. ) 90 each 1 04/23/2015 at Unknown time  . Biotin w/ Vitamins C & E 1250-7.5-7.5 MCG-MG-UNT CHEW Chew 1 tablet by mouth daily.   04/23/2015 at Unknown time  . ibuprofen (ADVIL,MOTRIN) 200 MG tablet Take 1,200 mg by mouth daily as needed for cramping.   04/04/2015  . Multiple Vitamin (MULTIVITAMIN WITH MINERALS) TABS Take 1 tablet by mouth daily. Reported on 04/12/2015   04/23/2015 at Unknown time  . sertraline (ZOLOFT) 50 MG tablet Take 1 tablet (50 mg total) by mouth daily. 30 tablet 5 04/23/2015 at Unknown time  . ibuprofen (ADVIL,MOTRIN) 800 MG tablet Take 1 tablet (800 mg total) by mouth every 6 (six) hours as needed. (Patient not taking: Reported on 04/12/2015) 30 tablet 0 Not Taking  . penicillin v potassium (VEETID) 500 MG tablet Take 1 tablet (500 mg total) by mouth 4 (four) times daily. (Patient not taking: Reported on 04/12/2015) 39 tablet 0 Not Taking  . triamcinolone cream (KENALOG) 0.1 % Apply 1 application topically 2 (two) times daily. (Patient not taking: Reported  on 04/23/2015) 30 g 0     ROS Physical Exam   Blood pressure 136/77, pulse 70, temperature 98.6 F (37 C), temperature source Oral, resp. rate 18, last menstrual period 04/20/2015.  Physical Exam  Nursing note and vitals reviewed. Constitutional: She is oriented to person, place, and time. She appears well-developed and well-nourished. No distress.  HENT:  Head: Normocephalic.  Eyes: Pupils are equal, round, and reactive to light.  Neck: Normal range of motion. Neck supple.  Cardiovascular: Normal rate.   Respiratory: Effort normal.  GI: Soft. She exhibits no distension. There is no tenderness. There is no rebound.  Genitourinary:  Small amount  of blood in vault; cervix clean, NT; uterus NSSC NT; adnexa without palpable masses or enlargement  Musculoskeletal: Normal range of motion.  Neurological: She is alert and oriented to person, place, and time.  Skin: Skin is warm and dry.  Psychiatric: She has a normal mood and affect.    MAU Course  Procedures Results for orders placed or performed during the hospital encounter of 04/23/15 (from the past 24 hour(s))  Urinalysis, Routine w reflex microscopic (not at [facility])     Status: Abnormal   Collection Time: 04/23/15 12:25 PM  Result Value Ref Range   Color, Urine YELLOW YELLOW   APPearance CLEAR CLEAR   Specific Gravity, Urine 1.025 1.005 - 1.030   pH 6.0 5.0 - 8.0   Glucose, UA NEGATIVE NEGATIVE mg/dL   Hgb urine dipstick LARGE (A) NEGATIVE   Bilirubin Urine NEGATIVE NEGATIVE   Ketones, ur NEGATIVE NEGATIVE mg/dL   Protein, ur NEGATIVE NEGATIVE mg/dL   Nitrite NEGATIVE NEGATIVE   Leukocytes, UA NEGATIVE NEGATIVE  Urine microscopic-add on     Status: Abnormal   Collection Time: 04/23/15 12:25 PM  Result Value Ref Range   Squamous Epithelial / LPF 0-5 (A) NONE SEEN   WBC, UA 0-5 0 - 5 WBC/hpf   RBC / HPF TOO NUMEROUS TO COUNT 0 - 5 RBC/hpf   Bacteria, UA FEW (A) NONE SEEN  Pregnancy, urine POC     Status: None   Collection Time: 04/23/15 12:42 PM  Result Value Ref Range   Preg Test, Ur NEGATIVE NEGATIVE  CBC     Status: None   Collection Time: 04/23/15  1:49 PM  Result Value Ref Range   WBC 9.6 4.0 - 10.5 K/uL   RBC 4.15 3.87 - 5.11 MIL/uL   Hemoglobin 12.4 12.0 - 15.0 g/dL   HCT 16.1 09.6 - 04.5 %   MCV 89.4 78.0 - 100.0 fL   MCH 29.9 26.0 - 34.0 pg   MCHC 33.4 30.0 - 36.0 g/dL   RDW 40.9 81.1 - 91.4 %   Platelets 290 150 - 400 K/uL  hCG, serum, qualitative     Status: None   Collection Time: 04/23/15  1:49 PM  Result Value Ref Range   Preg, Serum NEGATIVE NEGATIVE  Wet prep, genital     Status: Abnormal   Collection Time: 04/23/15  1:56 PM  Result Value Ref  Range   Yeast Wet Prep HPF POC NONE SEEN NONE SEEN   Trich, Wet Prep NONE SEEN NONE SEEN   Clue Cells Wet Prep HPF POC NONE SEEN NONE SEEN   WBC, Wet Prep HPF POC MODERATE (A) NONE SEEN   Sperm NONE SEEN      Assessment and Plan  Abnormal uterine bleeding- follow up with [facility] or GYN of choice for further evaluation and contraception Testing for GC/chlamydia and  HIV- pt will be contacted if positive Wet prep- pt to call back for results Substance abuse- recommend f/u with Dr. Earlie Counts for medication Counseling reinforced- recommended [facility] since nonprofit and sliding scale fee   Aryan Bello 04/23/2015, 2:08 PM

## 2015-04-24 LAB — GC/CHLAMYDIA PROBE AMP (~~LOC~~) NOT AT ARMC
Chlamydia: NEGATIVE
Neisseria Gonorrhea: NEGATIVE

## 2015-04-24 LAB — HIV ANTIBODY (ROUTINE TESTING W REFLEX): HIV Screen 4th Generation wRfx: NONREACTIVE

## 2015-05-10 ENCOUNTER — Ambulatory Visit (INDEPENDENT_AMBULATORY_CARE_PROVIDER_SITE_OTHER): Payer: BLUE CROSS/BLUE SHIELD | Admitting: Family Medicine

## 2015-05-10 ENCOUNTER — Telehealth: Payer: Self-pay

## 2015-05-10 VITALS — BP 120/80 | HR 70 | Temp 98.7°F | Resp 16 | Ht 66.0 in | Wt 213.0 lb

## 2015-05-10 DIAGNOSIS — H6982 Other specified disorders of Eustachian tube, left ear: Secondary | ICD-10-CM | POA: Diagnosis not present

## 2015-05-10 MED ORDER — FLUTICASONE PROPIONATE 50 MCG/ACT NA SUSP: 2.0000 | Freq: Every day | NASAL | Status: AC

## 2015-05-10 MED ORDER — IPRATROPIUM BROMIDE 0.03 % NA SOLN: 2.0000 | Freq: Four times a day (QID) | NASAL | Status: AC

## 2015-05-10 NOTE — Telephone Encounter (Signed)
I spoke with this Pt. And she has not been seen here previously for this issue and she stated she would come in

## 2015-05-10 NOTE — Progress Notes (Addendum)
Subjective:    Patient ID: Christine Berry, female    DOB: Aug 12, 1975, 40 y.o.   MRN: [id number] By signing my name below, I, Javier Docker, attest that this documentation has been prepared under the direction and in the presence of Norberto Sorenson, MD. Electronically Signed: Javier Docker, ER Scribe. 05/10/2015. 6:42 PM.  Chief Complaint  Patient presents with  . pt. felt something pop inside head    x 2 nights ago when she coughed   . Dizziness    x today   . Referral    pt wants on at [facility]     HPI HPI Comments: Christine Berry is a 40 y.o. female with a past hx of anxiety, and drug abuse who presents to [facility] complaining of hearing a pop in her head followed by dizziness. She has a past hx of ear infections.   She is worried she might have had an aneurism so she wants a referral for brain MRI.   She has been taking an OTC GERD medication. She also takes tumeric daily. She took one tylenol PM today. She is on zoloft.   She used to smoke cigarettes but quit recently.   Past Medical History  Diagnosis Date  . Medical history non-contributory   . Anxiety   . Drug abuse    No Known Allergies  Current Outpatient Prescriptions on File Prior to Visit  Medication Sig Dispense Refill  . baclofen (LIORESAL) 10 MG tablet Take 1 tablet (10 mg total) by mouth 3 (three) times daily. (Patient taking differently: Take 10 mg by mouth 3 (three) times daily as needed for muscle spasms. ) 90 each 1  . Biotin w/ Vitamins C & E 1250-7.5-7.5 MCG-MG-UNT CHEW Chew 1 tablet by mouth daily.    Marland Kitchen ibuprofen (ADVIL,MOTRIN) 800 MG tablet Take 1 tablet (800 mg total) by mouth every 6 (six) hours as needed. 30 tablet 0  . Multiple Vitamin (MULTIVITAMIN WITH MINERALS) TABS Take 1 tablet by mouth daily. Reported on 04/12/2015    . penicillin v potassium (VEETID) 500 MG tablet Take 1 tablet (500 mg total) by mouth 4 (four) times daily. 39 tablet 0  . sertraline (ZOLOFT) 50 MG tablet Take 1  tablet (50 mg total) by mouth daily. 30 tablet 5  . triamcinolone cream (KENALOG) 0.1 % Apply 1 application topically 2 (two) times daily. 30 g 0   No current facility-administered medications on file prior to visit.   Depression screen [facility] 2/9 05/10/2015 04/12/2015  Decreased Interest 0 0  Down, Depressed, Hopeless 0 0  PHQ - 2 Score 0 0     Review of Systems  Constitutional: Positive for fatigue. Negative for fever, chills, activity change, appetite change and unexpected weight change.  HENT: Positive for postnasal drip and rhinorrhea. Negative for congestion, ear pain, hearing loss, sinus pressure and tinnitus.   Neurological: Positive for dizziness. Negative for syncope, speech difficulty, weakness, light-headedness, numbness and headaches.  Hematological: Does not bruise/bleed easily.  Psychiatric/Behavioral: Negative for dysphoric mood. The patient is nervous/anxious.       Objective:  BP 120/80 mmHg  Pulse 70  Temp(Src) 98.7 F (37.1 C) (Oral)  Resp 16  Ht  (1.676 m)  Wt 213 lb (96.616 kg)  BMI 34.40 kg/m2  SpO2 93%  LMP 04/20/2015  Physical Exam  Constitutional: She is oriented to person, place, and time. She appears well-developed and well-nourished. No distress.  Pt is tearful.  HENT:  Head: Normocephalic  and atraumatic.  Mouth/Throat: Oropharynx is clear and moist. No oropharyngeal exudate.  Eyes: Pupils are equal, round, and reactive to light.  Neck: Neck supple.  Cardiovascular: Normal rate, regular rhythm and normal heart sounds.   No murmur heard. Pulmonary/Chest: Effort normal and breath sounds normal. No respiratory distress. She has no wheezes.  Musculoskeletal: Normal range of motion.  Neurological: She is alert and oriented to person, place, and time. Coordination normal.  Skin: Skin is warm and dry. She is not diaphoretic.  Psychiatric: She has a normal mood and affect. Her behavior is normal.  Nursing note and vitals reviewed.     Assessment &  Plan:   1. Eustachian tube dysfunction, left   pt reassured.  Explained that here is no reason to proceed to brain MRI currently since has been asymptomatic for sev d and exam nml. Pt very relieved and will rtc if any sxs dev.  She would like to loose weight and req medicine - advised trial of fiber supp prior to dinner to help decrease portion size.  Meds ordered this encounter  Medications  . fluticasone (FLONASE) 50 MCG/ACT nasal spray    Sig: Place 2 sprays into both nostrils at bedtime.    Dispense:  16 g    Refill:  2  . ipratropium (ATROVENT) 0.03 % nasal spray    Sig: Place 2 sprays into the nose 4 (four) times daily.    Dispense:  30 mL    Refill:  1    I personally performed the services described in this documentation, which was scribed in my presence. The recorded information has been reviewed and considered, and addended by me as needed.  Norberto Sorenson, MD MPH

## 2015-05-10 NOTE — Telephone Encounter (Signed)
Patient is asking for a referral to have a CT Scan because she states that her head is hurting so bad.   [phone]

## 2015-05-10 NOTE — Patient Instructions (Signed)

## 2015-05-10 NOTE — Telephone Encounter (Signed)
Pt. Called in today wanting to speak with a provider. I asked her what was this is regards to and she stated that she wanted a referral for a CT scan. I asked her has she been seen here before for her issue that she wants a CT scan and she stated she has not. I expressed to her that she would have to come in and be seen to be able to get the referral. She agreed to come in.

## 2015-05-20 ENCOUNTER — Other Ambulatory Visit: Payer: Self-pay | Admitting: Obstetrics

## 2015-05-20 DIAGNOSIS — Z1231 Encounter for screening mammogram for malignant neoplasm of breast: Secondary | ICD-10-CM

## 2015-05-20 LAB — LAB REPORT - SCANNED: PAP SMEAR: NEGATIVE

## 2015-05-31 ENCOUNTER — Ambulatory Visit: Payer: No Typology Code available for payment source

## 2015-06-28 ENCOUNTER — Ambulatory Visit: Payer: BLUE CROSS/BLUE SHIELD | Admitting: Podiatry

## 2015-07-12 ENCOUNTER — Ambulatory Visit (INDEPENDENT_AMBULATORY_CARE_PROVIDER_SITE_OTHER): Payer: Medicaid Other | Admitting: Podiatry

## 2015-07-12 ENCOUNTER — Encounter: Payer: Self-pay | Admitting: Podiatry

## 2015-07-12 VITALS — BP 106/60 | HR 69 | Resp 12

## 2015-07-12 DIAGNOSIS — B079 Viral wart, unspecified: Secondary | ICD-10-CM

## 2015-07-12 DIAGNOSIS — B351 Tinea unguium: Secondary | ICD-10-CM | POA: Diagnosis not present

## 2015-07-12 DIAGNOSIS — B078 Other viral warts: Secondary | ICD-10-CM

## 2015-07-12 NOTE — Progress Notes (Signed)
   Subjective:    Patient ID: Christine Berry, female    DOB: 03-04-1976, 40 y.o.   MRN: [id number]  HPI  RT FOOT  HAVE PLANTAR WARTS FOR 9 YEARS. FOOT IS LOOKING BETTER SINCE DR. TOM TREATED BY FREEZING THE WARTS-NO RELIEF.  Review of Systems  Musculoskeletal: Positive for myalgias.       Objective:   Physical Exam        Assessment & Plan:

## 2015-07-14 NOTE — Progress Notes (Signed)
Subjective:     Patient ID: Christine LaymanAngela D Cielo, female   DOB: 1975/10/19, 40 y.o.   MRN: [id number]  HPI patient is concerned about lesions on the plantar aspect of the right foot and thickening of nails with discoloration that she states is been this way for as long as she can remember   Review of Systems  All other systems reviewed and are negative.      Objective:   Physical Exam  Cardiovascular: Intact distal pulses.   Musculoskeletal: Normal range of motion.  Neurological: She is alert.  Skin: Skin is dry.  Nursing note and vitals reviewed.  neurovascular status intact muscle strength adequate range of motion within normal limits with patient found to have to keratotic lesions plantar aspect right that are moderately painful when pressed and thickened nails 1-5 both feet that's been present for a long time and does not bother her but she's concerned about the cosmetic appearance. Patient has good digital perfusion and is well oriented 3     Assessment:     Possibility for porokeratotic lesions versus verruca plantaris plantar aspect right and also nail disease 1-5 of both feet    Plan:     H&P and conditions reviewed with patient. I went ahead today debrided lesions and applied a small amount of chemical and explained what to do if it should blister with the hope that this will improve the condition of these lesions but I did explain they may be porokeratotic and may not respond. I then went ahead and recommended continued trimming for her nails due to the long-term length and failure to respond other conservative treatments

## 2015-08-13 ENCOUNTER — Emergency Department (HOSPITAL_COMMUNITY)
Admission: EM | Admit: 2015-08-13 | Discharge: 2015-08-13 | Disposition: A | Discharge status: Home / Self Care | Payer: No Typology Code available for payment source

## 2015-08-13 NOTE — ED Notes (Signed)
Not in lobby

## 2015-08-13 NOTE — ED Notes (Signed)
Pt did not respond when named call to treatment area

## 2015-08-13 NOTE — ED Notes (Signed)
Did not respond to name. Triaage RN informed

## 2015-09-07 ENCOUNTER — Emergency Department (HOSPITAL_COMMUNITY)
Admission: EM | Admit: 2015-09-07 | Discharge: 2015-09-07 | Discharge status: Against Medical Advice | Payer: Medicaid Other | Attending: Emergency Medicine | Admitting: Emergency Medicine

## 2015-09-07 ENCOUNTER — Encounter (HOSPITAL_COMMUNITY): Payer: Self-pay | Admitting: Emergency Medicine

## 2015-09-07 DIAGNOSIS — Z791 Long term (current) use of non-steroidal anti-inflammatories (NSAID): Secondary | ICD-10-CM | POA: Diagnosis not present

## 2015-09-07 DIAGNOSIS — F17200 Nicotine dependence, unspecified, uncomplicated: Secondary | ICD-10-CM | POA: Diagnosis not present

## 2015-09-07 DIAGNOSIS — Z792 Long term (current) use of antibiotics: Secondary | ICD-10-CM | POA: Insufficient documentation

## 2015-09-07 DIAGNOSIS — Z79899 Other long term (current) drug therapy: Secondary | ICD-10-CM | POA: Diagnosis not present

## 2015-09-07 DIAGNOSIS — M25571 Pain in right ankle and joints of right foot: Secondary | ICD-10-CM | POA: Insufficient documentation

## 2015-09-07 DIAGNOSIS — M25572 Pain in left ankle and joints of left foot: Secondary | ICD-10-CM

## 2015-09-07 NOTE — Discharge Instructions (Signed)
Return here as needed. Follow up with the orthopedist provided.  °

## 2015-09-07 NOTE — ED Notes (Signed)
Patient states that she works out and started having pain in her left leg.  Patient has increased in the last few days.  She states that her left ankle also has pain.

## 2015-09-07 NOTE — ED Provider Notes (Signed)
CSN: [id number]     Arrival date & time 09/07/15  [id number] History   First MD Initiated Contact with Patient 09/07/15 [phone]     Chief Complaint  Patient presents with  . Leg Pain     (Consider location/radiation/quality/duration/timing/severity/associated sxs/prior Treatment) HPI Patient presents to the emergency department with a 3 month history of calf discomfort that is intermittent.  The patient states that she started having left ankle pain yesterday.  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  She stands and walks a lot and does vigorous activity.  The patient states that she applied some cream to her ankle, which seemed to help slightly but did not improve her symptoms dramatically.  The patient states that the calf pain seems to be worse with palpation.The patient denies chest pain, shortness of breath, headache,blurred vision, neck pain, fever, cough, weakness, numbness, dizziness, anorexia, edema, abdominal pain, nausea, vomiting, diarrhea, rash, back pain, dysuria, hematemesis, bloody stool, near syncope, or syncope. Past Medical History  Diagnosis Date  . Medical history non-contributory   . Anxiety   . Drug abuse    Past Surgical History  Procedure Laterality Date  . Cesarean section     No family history on file. Social History  Substance Use Topics  . Smoking status: Current Every Day Smoker -- 0.50 packs/day  . Smokeless tobacco: Never Used  . Alcohol Use: No   OB History    Gravida Para Term Preterm AB TAB SAB Ectopic Multiple Living   13 6   7 7    6      Review of Systems All other systems negative except as documented in the HPI. All pertinent positives and negatives as reviewed in the HPI.    Allergies  Review of patient's allergies indicates no known allergies.  Home Medications   Prior to Admission medications   Medication Sig Start Date End Date Taking? Authorizing Provider  baclofen (LIORESAL) 10 MG tablet Take 1 tablet (10 mg total) by mouth 3 (three)  times daily. Patient taking differently: Take 10 mg by mouth 3 (three) times daily as needed for muscle spasms.  04/12/15   Peyton Najjaravid H Hopper, MD  Biotin w/ Vitamins C & E 1250-7.5-7.5 MCG-MG-UNT CHEW Chew 1 tablet by mouth daily.    Historical Provider, MD  fluticasone (FLONASE) 50 MCG/ACT nasal spray Place 2 sprays into both nostrils at bedtime. 05/10/15   Sherren MochaEva N Shaw, MD  ibuprofen (ADVIL,MOTRIN) 800 MG tablet Take 1 tablet (800 mg total) by mouth every 6 (six) hours as needed. 10/03/14   Earley FavorGail Schulz, NP  ipratropium (ATROVENT) 0.03 % nasal spray Place 2 sprays into the nose 4 (four) times daily. 05/10/15   Sherren MochaEva N Shaw, MD  Multiple Vitamin (MULTIVITAMIN WITH MINERALS) TABS Take 1 tablet by mouth daily. Reported on 04/12/2015    Historical Provider, MD  penicillin v potassium (VEETID) 500 MG tablet Take 1 tablet (500 mg total) by mouth 4 (four) times daily. 10/03/14   Earley FavorGail Schulz, NP  sertraline (ZOLOFT) 50 MG tablet Take 1 tablet (50 mg total) by mouth daily. 04/12/15   Peyton Najjaravid H Hopper, MD  triamcinolone cream (KENALOG) 0.1 % Apply 1 application topically 2 (two) times daily. 04/12/15   Peyton Najjaravid H Hopper, MD   BP 126/74 mmHg  Pulse 69  Temp(Src) 97.8 F (36.6 C) (Oral)  Resp 17  SpO2 99%  LMP 08/31/2015 Physical Exam  Constitutional: She is oriented to person, place, and time. She appears well-developed and well-nourished. No distress.  HENT:  Head: Normocephalic and atraumatic.  Mouth/Throat: Oropharynx is clear and moist.  Eyes: Pupils are equal, round, and reactive to light.  Neck: Normal range of motion. Neck supple.  Cardiovascular: Normal rate, regular rhythm and normal heart sounds.  Exam reveals no gallop and no friction rub.   No murmur heard. Pulmonary/Chest: Effort normal and breath sounds normal. No respiratory distress. She has no wheezes.  Abdominal: Soft. Bowel sounds are normal. She exhibits no distension. There is no tenderness.  Neurological: She is alert and oriented to person, place,  and time. She exhibits normal muscle tone. Coordination normal.  Skin: Skin is warm and dry. No rash noted. No erythema.  Psychiatric: She has a normal mood and affect. Her behavior is normal.  Nursing note and vitals reviewed.   ED Course  Procedures (including critical care time) Labs Review Labs Reviewed - No data to display  Imaging Review No results found. I have personally reviewed and evaluated these images and lab results as part of my medical decision-making.   Patient did not want any further testing.  She states she has to go to work.  She advised that she would like an Eagle stabilizing splint and Ace wrap, but then left without any of these and her discharge paperwork.     Charlestine Night, PA-C 09/08/15 1747  Margarita Grizzle, MD 09/11/15 1100

## 2015-09-07 NOTE — ED Notes (Signed)
Found patient walking toward exit.  Patient stated she did not want to wait for discharge or ankle brace and [facility]

## 2015-10-30 ENCOUNTER — Telehealth: Payer: Self-pay | Admitting: Family Medicine

## 2015-10-30 DIAGNOSIS — F32A Depression, unspecified: Secondary | ICD-10-CM

## 2015-10-30 DIAGNOSIS — F411 Generalized anxiety disorder: Secondary | ICD-10-CM

## 2015-10-30 DIAGNOSIS — F329 Major depressive disorder, single episode, unspecified: Secondary | ICD-10-CM

## 2015-10-30 DIAGNOSIS — R252 Cramp and spasm: Secondary | ICD-10-CM

## 2015-10-30 NOTE — Telephone Encounter (Signed)
Patient request for Dr. Clelia Croft to call [facility] on [address] to refill her prescription Zoloft. Patient want someone to call her back to confirm the medication was called in. [phone].

## 2015-10-31 MED ORDER — SERTRALINE HCL 50 MG PO TABS: 50.0000 mg | ORAL_TABLET | Freq: Every day | ORAL | 0 refills | Status: AC

## 2015-10-31 MED ORDER — BACLOFEN 10 MG PO TABS: 10.0000 mg | ORAL_TABLET | Freq: Three times a day (TID) | ORAL | 0 refills | Status: AC | PRN

## 2015-10-31 NOTE — Telephone Encounter (Signed)
Pt wants Rx for Baclofen as well, she states she will make an appointment when I return her call.

## 2015-10-31 NOTE — Telephone Encounter (Signed)
Meds ordered this encounter  Medications  . sertraline (ZOLOFT) 50 MG tablet    Sig: Take 1 tablet (50 mg total) by mouth daily.    Dispense:  30 tablet    Refill:  0  . baclofen (LIORESAL) 10 MG tablet    Sig: Take 1 tablet (10 mg total) by mouth 3 (three) times daily as needed for muscle spasms.    Dispense:  90 tablet    Refill:  0    Order Specific Question:   Supervising Provider    Answer:   Clelia Croft, EVA N [[id number]]

## 2015-10-31 NOTE — Telephone Encounter (Signed)
Rs was sent.

## 2015-11-01 NOTE — Telephone Encounter (Signed)
Called and LMOM to advise that both Rxs were called in and that she may call back for traditional or same day appt.

## 2015-11-07 ENCOUNTER — Ambulatory Visit: Payer: Medicaid Other | Admitting: Podiatry

## 2015-11-13 ENCOUNTER — Ambulatory Visit: Payer: Medicaid Other | Admitting: Podiatry

## 2015-11-30 ENCOUNTER — Encounter: Payer: Self-pay | Admitting: *Deleted

## 2016-06-11 ENCOUNTER — Ambulatory Visit (INDEPENDENT_AMBULATORY_CARE_PROVIDER_SITE_OTHER): Payer: Medicaid Other

## 2016-06-11 ENCOUNTER — Ambulatory Visit (INDEPENDENT_AMBULATORY_CARE_PROVIDER_SITE_OTHER): Payer: Medicaid Other | Admitting: Orthopedic Surgery

## 2016-06-11 ENCOUNTER — Encounter (INDEPENDENT_AMBULATORY_CARE_PROVIDER_SITE_OTHER): Payer: Self-pay | Admitting: Orthopedic Surgery

## 2016-06-11 VITALS — Ht 66.0 in | Wt 213.0 lb

## 2016-06-11 DIAGNOSIS — M47812 Spondylosis without myelopathy or radiculopathy, cervical region: Secondary | ICD-10-CM | POA: Insufficient documentation

## 2016-06-11 DIAGNOSIS — M47817 Spondylosis without myelopathy or radiculopathy, lumbosacral region: Secondary | ICD-10-CM

## 2016-06-11 MED ORDER — PREDNISONE 10 MG PO TABS: 20.0000 mg | ORAL_TABLET | Freq: Every day | ORAL | 0 refills | Status: DC

## 2016-06-11 NOTE — Progress Notes (Signed)
Office Visit Note   Patient: Christine Berry           Date of Birth: November 10, 1975           MRN: [id number] Visit Date: 06/11/2016              Requested by: No referring provider defined for this encounter. PCP: Kathreen CosierMARSHALL,BERNARD A, MD (Inactive)  Chief Complaint  Patient presents with  . Lower Back - Pain  . Neck - Pain    HPI: Patient states that she is having pain that begins at the base of her neck and runs all the way down her spine to her low back. She has been exercising and trying to lose weight and feels that maybe her routine may be aggravating her neck and back. She states that she is not able to stretch and her sleep has decreased due to the discomfort and wanted to talk about pillows and mattress foam toppers that could add additional support while she is sleeping.  Rodena MedinAutumn L Forrest, RMA     Assessment & Plan: Visit Diagnoses:  1. Spondylosis without myelopathy or radiculopathy, cervical region   2. Spondylosis without myelopathy or radiculopathy, lumbosacral region     Plan: We will call him a prescription for prednisone to take 20 mg every morning. Recommended massage continue using heat and follow-up in 3 weeks. Also discussed working on range of motion exercises for her neck.  Follow-Up Instructions: Return in about 3 weeks (around 07/02/2016).   Ortho Exam On examination patient is alert oriented no adenopathy well-dressed normal affect normal respiratory effort she has a normal gait. Examination of her cervical spine she does have decreased range of motion with lateral rotation of about 45 flexion of about 45 and extension to neutral. Thoracic outlet is tender to palpation bilaterally. She has no focal motor weakness in either upper extremity of all motor groups. Examination of her lumbar spine she has no focal tenderness and no focal weakness in the lower extremities. Her radiographs shows a focal disc disease at C5-6. ROS: Complete review of systems  negative except as mentioned in the history of present illness. Imaging: Xr Cervical Spine 2 Or 3 Views  Result Date: 06/11/2016 2 view radiographs of the cervical spine shows a large osteophytic bone spur at C5-6. No other bony abnormalities.  Xr Lumbar Spine 2-3 Views  Result Date: 06/11/2016 Two-view radiographs of the lumbar spine shows some sclerotic changes of the facet joints but no significant disc space narrowing.   Labs: No results found for: HGBA1C, ESRSEDRATE, CRP, LABURIC, REPTSTATUS, GRAMSTAIN, CULT, LABORGA  Orders:  Orders Placed This Encounter  Procedures  . XR Cervical Spine 2 or 3 views  . XR Lumbar Spine 2-3 Views   No orders of the defined types were placed in this encounter.    Procedures: No procedures performed  Clinical Data: No additional findings.  Subjective: Review of Systems  Objective: Vital Signs: Ht 5\' 6"  (1.676 m)   Wt 213 lb (96.6 kg)   BMI 34.38 kg/m   Specialty Comments:  No specialty comments available.  PMFS History: Patient Active Problem List   Diagnosis Date Noted  . Spondylosis without myelopathy or radiculopathy, lumbosacral region 06/11/2016  . Spondylosis without myelopathy or radiculopathy, cervical region 06/11/2016   Past Medical History:  Diagnosis Date  . Anxiety   . Drug abuse   . Medical history non-contributory     No family history on file.  Past Surgical History:  Procedure Laterality Date  . CESAREAN SECTION     Social History   Occupational History  . Not on file.   Social History Main Topics  . Smoking status: Current Every Day Smoker    Packs/day: 0.50  . Smokeless tobacco: Never Used  . Alcohol use No  . Drug use: No  . Sexual activity: Yes    Birth control/ protection: Condom

## 2016-07-02 ENCOUNTER — Ambulatory Visit (INDEPENDENT_AMBULATORY_CARE_PROVIDER_SITE_OTHER): Payer: Medicaid Other | Admitting: Orthopedic Surgery

## 2016-12-16 ENCOUNTER — Other Ambulatory Visit (INDEPENDENT_AMBULATORY_CARE_PROVIDER_SITE_OTHER): Payer: Self-pay | Admitting: Orthopedic Surgery

## 2017-01-14 ENCOUNTER — Other Ambulatory Visit: Payer: Self-pay | Admitting: Internal Medicine

## 2017-01-14 DIAGNOSIS — N644 Mastodynia: Secondary | ICD-10-CM

## 2017-01-18 ENCOUNTER — Other Ambulatory Visit: Payer: No Typology Code available for payment source

## 2017-10-26 ENCOUNTER — Ambulatory Visit: Payer: Medicaid Other | Attending: Family Medicine | Admitting: Physical Therapy

## 2017-10-26 DIAGNOSIS — R293 Abnormal posture: Secondary | ICD-10-CM | POA: Insufficient documentation

## 2017-10-26 DIAGNOSIS — M6281 Muscle weakness (generalized): Secondary | ICD-10-CM | POA: Insufficient documentation

## 2017-10-26 DIAGNOSIS — M545 Low back pain: Secondary | ICD-10-CM | POA: Insufficient documentation

## 2017-10-26 DIAGNOSIS — G8929 Other chronic pain: Secondary | ICD-10-CM | POA: Insufficient documentation

## 2017-11-03 ENCOUNTER — Encounter: Payer: Self-pay | Admitting: Physical Therapy

## 2017-11-03 ENCOUNTER — Ambulatory Visit: Payer: Medicaid Other | Admitting: Physical Therapy

## 2017-11-03 ENCOUNTER — Other Ambulatory Visit: Payer: Self-pay

## 2017-11-03 DIAGNOSIS — M545 Low back pain: Secondary | ICD-10-CM | POA: Diagnosis present

## 2017-11-03 DIAGNOSIS — M6281 Muscle weakness (generalized): Secondary | ICD-10-CM

## 2017-11-03 DIAGNOSIS — R293 Abnormal posture: Secondary | ICD-10-CM

## 2017-11-03 DIAGNOSIS — G8929 Other chronic pain: Secondary | ICD-10-CM | POA: Diagnosis present

## 2017-11-03 NOTE — Patient Instructions (Signed)
   Lumbar Rotations   Lying on your back with your knees bent, slowly drop your legs to one side and hold the stretch. Come back to the middle and switch sides. You should feel the stretch in your back on the opposite side that your legs are leaning.   Hold for 10 seconds, then relax.  Repeat 5 times each side, twice a day or more if needed for pain.     DOUBLE KNEE TO CHEST STRETCH - DKTC  While Lying on your back,  hold your knees and gently pull them up towards your chest.  Hold for 10 seconds, then relax.  Repeat 5 times, twice a day or more if needed for pain control.     Transverse Abdominus Activation  Lying on your back, pull your bellybutton into your spine.   Hold for 3 seconds, then relax.  Repeat 10-20 times, 3-5 times per day.    BRIDGING  While lying on your back with knees bent, tighten your lower abdominals, squeeze your buttocks and then raise your buttocks off the floor/bed as creating a "Bridge" with your body.  Repeat 10 times with 5 second holds, twice a day.

## 2017-11-03 NOTE — Therapy (Signed)
[facility] [address] [place], [place], [address] Phone: [phone]   Fax:  [phone]  Physical Therapy Evaluation  Patient Details  Name: Christine Berry MRN: [id number] Date of Birth: 03-03-1976 Referring Provider: Mikael Spray    Encounter Date: 11/03/2017  PT End of Session - 11/03/17 1634    Visit Number  1    Number of Visits  4    Date for PT Re-Evaluation  11/24/17    Authorization Type  Medicaid (requested 3 visits at initial auth)    Authorization Time Period  11/03/17 to 12/01/17    Authorization - Visit Number  1    Authorization - Number of Visits  4    PT Start Time  1605    PT Stop Time  1630    PT Time Calculation (min)  25 min    Activity Tolerance  Patient tolerated treatment well    Behavior During Therapy  [facility] for tasks assessed/performed       Past Medical History:  Diagnosis Date  . Anxiety   . Drug abuse (HCC)   . Medical history non-contributory     Past Surgical History:  Procedure Laterality Date  . CESAREAN SECTION      There were no vitals filed for this visit.   Subjective Assessment - 11/03/17 1604    Subjective  I fell 2 years ago and broke my coccyx; it healed inappropriately and I am seeing an orthopedic inappropriately. The pain is referring all the way up to my neck, it hurts to do everything. No numbness or tingling, no bowel or bladder changes.     Patient Stated Goals  reduce pain     Currently in Pain?  Yes    Pain Score  7     Pain Location  Back    Pain Orientation  Lower    Pain Descriptors / Indicators  Aching;Constant;Tiring    Pain Type  Chronic pain    Pain Radiating Towards  radiates up back     Pain Onset  More than a month ago    Pain Frequency  Constant    Aggravating Factors   everything     Pain Relieving Factors  hot tub     Effect of Pain on Daily Activities  severe         [facility] PT Assessment - 11/03/17 0001      Assessment   Medical  Diagnosis  back pain     Referring Provider  Mikael Spray     Onset Date/Surgical Date  -- 2 years ago     Next MD Visit  Dr. Malen Gauze in a week or so     Prior Therapy  none       Precautions   Precautions  None      Restrictions   Weight Bearing Restrictions  No      Balance Screen   Has the patient fallen in the past 6 months  Yes    How many times?  1- skating     Has the patient had a decrease in activity level because of a fear of falling?   No    Is the patient reluctant to leave their home because of a fear of falling?   No      Prior Function   Level of Independence  Independent;Independent with basic ADLs;Independent with transfers;Independent with gait    Chartered certified accountant    Leisure  skating, fishing, hanging  out with son, reading       ROM / Strength   AROM / PROM / Strength  AROM;Strength      AROM   AROM Assessment Site  Lumbar    Lumbar Flexion  full ROM, painful coming up    Lumbar Extension  moderate limitaiton, painful     Lumbar - Right Side Bend  WNL, painful     Lumbar - Left Side Bend  WNL       Strength   Strength Assessment Site  Knee;Hip    Right/Left Hip  Right;Left    Right Hip Flexion  4+/5    Right Hip Extension  3/5 resistance not tested due to pain     Right Hip ABduction  4/5    Left Hip Flexion  4+/5    Left Hip Extension  3/5 MMT not tested due to pain     Left Hip ABduction  4/5    Right/Left Knee  Right;Left    Right Knee Extension  4+/5    Left Knee Extension  4/5      Flexibility   Soft Tissue Assessment /Muscle Length  yes    Hamstrings  WNL     Piriformis  mild piriformis       Palpation   Palpation comment  sacrum tender to PA pressure, lumbar spine stiff but not painful                 Objective measurements completed on examination: See above findings.              PT Education - 11/03/17 1634    Education Details  time limitations today, HEP, POC, prognosis, Medicaid limitations     Person(s)  Educated  Patient;Child(ren)    Methods  Explanation;Handout;Demonstration    Comprehension  Verbalized understanding;Returned demonstration       PT Short Term Goals - 11/03/17 1637      PT SHORT TERM GOAL #1   Title  Patient to be compliant with appropriate HEP, to be updated PRN     Time  3    Period  Weeks    Status  New    Target Date  11/24/17      PT SHORT TERM GOAL #2   Title  Patient to be able to move into lumbar extension and lateral flexion without increased pain in order to show improvement of condition     Time  3    Period  Weeks    Status  New      PT SHORT TERM GOAL #3   Title  Patient to report low back pain as being no more than 2/10 in order to show improvement in condition     Time  3    Period  Weeks    Status  New      PT SHORT TERM GOAL #4   Title  Patient to demonstrate correct functional mechanics for bed mobility and floor to waist lifting in order to prevent exacerbating back pain     Time  3    Period  Weeks    Status  New                Plan - 11/03/17 1635    Clinical Impression Statement  Patient arrives with significant back pain after fracturing her coccyx while skating 2 years ago; she reports that she was told it did not heal right and she believes a lot of her pain  is due to this. Examination reveals functional muscle weakness, postural impairments, core weakness, and mild flexibility deficit. This evaluation was very time limited as patient arrived late today, and she will require further examination next session. She may benefit from a trial of skilled PT services to address functional deficits and reduce pain moving forward.     History and Personal Factors relevant to plan of care:  coccyx fracture 2 years ago    Clinical Presentation  Stable    Clinical Decision Making  Low    Rehab Potential  Good    PT Frequency  1x / week    PT Duration  3 weeks    PT Treatment/Interventions  ADLs/Self Care Home  Management;Biofeedback;Cryotherapy;Electrical Stimulation;Iontophoresis 4mg /ml Dexamethasone;Moist Heat;Ultrasound;DME Instruction;Stair training;Gait training;Functional mobility training;Therapeutic activities;Therapeutic exercise;Balance training;Neuromuscular re-education;Patient/family education;Manual techniques;Passive range of motion;Dry needling;Taping    PT Next Visit Plan  needs to be with PT for 2nd session! Further assess SI region and special tests for lumbar spine/hips. Check and see if neck was included in referral, if so assess. Otherwise lumbar mobility and core, SI interventions as appropriate     PT Home Exercise Plan  Eval: DKTC, lumbar rotations, bridges, TA sets     Consulted and Agree with Plan of Care  Patient       Patient will benefit from skilled therapeutic intervention in order to improve the following deficits and impairments:  Increased fascial restricitons, Improper body mechanics, Pain, Postural dysfunction, Decreased strength, Hypomobility, Impaired flexibility  Visit Diagnosis: Chronic bilateral low back pain without sciatica - Plan: PT plan of care cert/re-cert  Muscle weakness (generalized) - Plan: PT plan of care cert/re-cert  Abnormal posture - Plan: PT plan of care cert/re-cert     Problem List Patient Active Problem List   Diagnosis Date Noted  . Spondylosis without myelopathy or radiculopathy, lumbosacral region 06/11/2016  . Spondylosis without myelopathy or radiculopathy, cervical region 06/11/2016    Nedra HaiKristen Unger PT, DPT, CBIS  Supplemental Physical Therapist [facility]   Pager [phone]   [facility] [address] [place], [place], [address] Phone: [phone]   Fax:  [phone]  Name: Christine Berry MRN: [id number] Date of Birth: 20-Feb-1976

## 2017-11-10 ENCOUNTER — Encounter: Payer: Self-pay | Admitting: Physical Therapy

## 2017-11-10 ENCOUNTER — Ambulatory Visit: Payer: Medicaid Other | Attending: Family Medicine | Admitting: Physical Therapy

## 2017-11-10 DIAGNOSIS — R293 Abnormal posture: Secondary | ICD-10-CM | POA: Insufficient documentation

## 2017-11-10 DIAGNOSIS — G8929 Other chronic pain: Secondary | ICD-10-CM | POA: Diagnosis present

## 2017-11-10 DIAGNOSIS — M545 Low back pain: Secondary | ICD-10-CM | POA: Diagnosis not present

## 2017-11-10 DIAGNOSIS — M6281 Muscle weakness (generalized): Secondary | ICD-10-CM | POA: Diagnosis present

## 2017-11-10 NOTE — Therapy (Addendum)
[place], [place], [address] Phone: [phone]   Fax:  [phone]  Physical Therapy Treatment  Patient Details  Name: Christine Berry MRN: [id number] Date of Birth: [birth year] Referring Provider: Kellie Shropshire    Encounter Date: 11/10/2017  PT End of Session - 11/10/17 1333    Visit Number  2    Number of Visits  4    Date for PT Re-Evaluation  11/24/17    Authorization Type  Medicaid (3 visits at initial auth)    PT Start Time  0698    PT Stop Time  1434    PT Time Calculation (min)  61 min    Activity Tolerance  Patient tolerated treatment well       Past Medical History:  Diagnosis Date  . Anxiety   . Drug abuse ([place])   . Medical history non-contributory     Past Surgical History:  Procedure Laterality Date  . CESAREAN SECTION      There were no vitals filed for this visit.  Subjective Assessment - 11/10/17 1336    Subjective  Christine Berry reports she has been doing her HEP, she doesn't like taking the perscription meds because she doesn't think they work as well.  She sees him today at 3. She is going to ask for a different type of medication. Still having back pain Returns to school on the 18th    Patient Stated Goals  reduce pain     Currently in Pain?  Yes    Pain Score  10-Worst pain ever    Pain Location  Back    Pain Orientation  Lower    Pain Descriptors / Indicators  Constant;Aching    Pain Type  Chronic pain    Pain Onset  More than a month ago    Pain Frequency  Constant    Aggravating Factors   prolonged walking and standing, lying  on Rt side    Pain Relieving Factors  the lifting exercise sometimes.                        [facility] Adult PT Treatment/Exercise - 11/10/17 0001      Self-Care   Self-Care  Other Self-Care Comments      Exercises   Exercises  Lumbar      Lumbar Exercises: Aerobic   Nustep  L4x5'      Lumbar Exercises: Supine   Pelvic  Tilt  5 reps;5 seconds VC to breathe, pt was holding and felt dizzy    Clam  10 reps VC for form and to breathe    Bridge with [employer]  10 reps      Modalities   Modalities  Electrical Stimulation;Moist Heat      Moist Heat Therapy   Number Minutes Moist Heat  15 Minutes    Moist Heat Location  Lumbar Spine;Cervical      Electrical Stimulation   Electrical Stimulation Location  sacrum    Electrical Stimulation Action  IFC    Electrical Stimulation Parameters  to tolerance    Electrical Stimulation Goals  Pain      Manual Therapy   Manual Therapy  Joint mobilization;Myofascial release;Soft tissue mobilization    Joint Mobilization  grade III lumbar and sacral mobs CPA     Soft tissue mobilization  STM to Lt gluts/piriformis with TPR    Myofascial Release  sacral iliac ligament releases.  PT Education - 11/10/17 1515    Education Details  self TPR, DN, home TENs unit    Person(s) Educated  Patient    Methods  Explanation;Handout;Demonstration    Comprehension  Verbalized understanding;Returned demonstration       PT Short Term Goals - 11/10/17 1515      PT SHORT TERM GOAL #1   Title  Patient to be compliant with appropriate HEP, to be updated PRN     Status  On-going      PT SHORT TERM GOAL #2   Title  Patient to be able to move into lumbar extension and lateral flexion without increased pain in order to show improvement of condition     Status  On-going      PT SHORT TERM GOAL #3   Title  Patient to report low back pain as being no more than 2/10 in order to show improvement in condition     Status  On-going      PT SHORT TERM GOAL #4   Title  Patient to demonstrate correct functional mechanics for bed mobility and floor to waist lifting in order to prevent exacerbating back pain     Status  On-going               Plan - 11/10/17 [place] continues with a lof of sacral/coxygeal pain still.  She is  performing her HEP. She reported some relief with sacral releases and mobs as well as stim and heat. She reports neck and knee/leg pain and is going to ask her MD for an order for these as well.  [name] would benefit from DN to the gluts and hip rotators. No goals met yet, she has been approved for 3 visits so far, she will need more.     Rehab Potential  Good    PT Frequency  1x / week    PT Duration  3 weeks    PT Treatment/Interventions  ADLs/Self Care Home Management;Biofeedback;Cryotherapy;Electrical Stimulation;Iontophoresis 70m/ml Dexamethasone;Moist Heat;Ultrasound;DME Instruction;Stair training;Gait training;Functional mobility training;Therapeutic activities;Therapeutic exercise;Balance training;Neuromuscular re-education;Patient/family education;Manual techniques;Passive range of motion;Dry needling;Taping    PT Next Visit Plan  if patient has order for neck assess this.         Patient will benefit from skilled therapeutic intervention in order to improve the following deficits and impairments:  Increased fascial restricitons, Improper body mechanics, Pain, Postural dysfunction, Decreased strength, Hypomobility, Impaired flexibility  Visit Diagnosis: Chronic bilateral low back pain without sciatica  Muscle weakness (generalized)  Abnormal posture     Problem List Patient Active Problem List   Diagnosis Date Noted  . Spondylosis without myelopathy or radiculopathy, lumbosacral region 06/11/2016  . Spondylosis without myelopathy or radiculopathy, cervical region 06/11/2016    SJeral PinchPT  11/10/2017, 3:24 PM  [facility] [place] [address]Phone: [phone]  Fax:  [phone] Name: Christine OKAMRN: [id number] of Birth: [birth year]  PHYSICAL THERAPY DISCHARGE SUMMARY  Visits from Start of Care: 2 Current functional level related to goals / functional outcomes: Unknown,  patient has not returned for tx since this visit   Remaining deficits: unknown   Education / Equipment: HEP Plan:  Patient goals were not met. Patient is being discharged due to not returning since the last visit.  ?????     Christine Berry, PT 02/28/18 8:19 AM

## 2017-11-10 NOTE — Patient Instructions (Signed)
Self trigger point release with tennis ball Use the ball against a wall and find the tender spots in your buttocks and roll around to work out the tenderness.   Trigger Point Dry Needling  . What is Trigger Point Dry Needling (DN)? o DN is a physical therapy technique used to treat muscle pain and dysfunction. Specifically, DN helps deactivate muscle trigger points (muscle knots).  o A thin filiform needle is used to penetrate the skin and stimulate the underlying trigger point. The goal is for a local twitch response (LTR) to occur and for the trigger point to relax. No medication of any kind is injected during the procedure.   . What Does Trigger Point Dry Needling Feel Like?  o The procedure feels different for each individual patient. Some patients report that they do not actually feel the needle enter the skin and overall the process is not painful. Very mild bleeding may occur. However, many patients feel a deep cramping in the muscle in which the needle was inserted. This is the local twitch response.   Marland Kitchen. How Will I feel after the treatment? o Soreness is normal, and the onset of soreness may not occur for a few hours. Typically this soreness does not last longer than two days.  o Bruising is uncommon, however; ice can be used to decrease any possible bruising.  o In rare cases feeling tired or nauseous after the treatment is normal. In addition, your symptoms may get worse before they get better, this period will typically not last longer than 24 hours.   . What Can I do After My Treatment? o Increase your hydration by drinking more water for the next 24 hours. o You may place ice or heat on the areas treated that have become sore, however, do not use heat on inflamed or bruised areas. Heat often brings more relief post needling. o You can continue your regular activities, but vigorous activity is not recommended initially after the treatment for 24 hours. o DN is best combined with  other physical therapy such as strengthening, stretching, and other therapies.   TENS UNIT: This is helpful for muscle pain and spasm.   Search and Purchase a TENS 7000 2nd edition at www.tenspros.com. It should be less than $30.     TENS unit instructions: Do not shower or bathe with the unit on Turn the unit off before removing electrodes or batteries If the electrodes lose stickiness add a drop of water to the electrodes after they are disconnected from the unit and place on plastic sheet. If you continued to have difficulty, call the TENS unit company to purchase more electrodes. Do not apply lotion on the skin area prior to use. Make sure the skin is clean and dry as this will help prolong the life of the electrodes. After use, always check skin for unusual red areas, rash or other skin difficulties. If there are any skin problems, does not apply electrodes to the same area. Never remove the electrodes from the unit by pulling the wires. Do not use the TENS unit or electrodes other than as directed. Do not change electrode placement without consultating your therapist or physician. Keep 2 fingers with between each electrode. Wear time ratio is 2:1, on to off times.    For example on for 30 minutes off for 15 minutes and then on for 30 minutes off for 15 minutes

## 2017-11-16 ENCOUNTER — Ambulatory Visit: Payer: Medicaid Other | Admitting: Physical Therapy

## 2017-11-16 ENCOUNTER — Telehealth: Payer: Self-pay | Admitting: Physical Therapy

## 2017-11-16 NOTE — Telephone Encounter (Signed)
Left message on patients cell phone, she missed her appointment today.  Left return number for her to call if she had questions.

## 2017-11-22 ENCOUNTER — Ambulatory Visit: Payer: Medicaid Other | Admitting: Physical Therapy

## 2017-11-23 ENCOUNTER — Ambulatory Visit: Payer: Medicaid Other | Admitting: Physical Therapy

## 2017-12-20 ENCOUNTER — Other Ambulatory Visit (HOSPITAL_BASED_OUTPATIENT_CLINIC_OR_DEPARTMENT_OTHER): Payer: Self-pay

## 2017-12-20 DIAGNOSIS — G4710 Hypersomnia, unspecified: Secondary | ICD-10-CM

## 2017-12-24 ENCOUNTER — Ambulatory Visit (HOSPITAL_BASED_OUTPATIENT_CLINIC_OR_DEPARTMENT_OTHER): Payer: Medicaid Other | Attending: Internal Medicine

## 2017-12-30 ENCOUNTER — Emergency Department (HOSPITAL_COMMUNITY)
Admission: EM | Admit: 2017-12-30 | Discharge: 2017-12-30 | Disposition: A | Discharge status: Home / Self Care | Payer: Medicaid Other | Attending: Emergency Medicine | Admitting: Emergency Medicine

## 2017-12-30 ENCOUNTER — Emergency Department (HOSPITAL_COMMUNITY): Payer: Medicaid Other

## 2017-12-30 ENCOUNTER — Encounter (HOSPITAL_COMMUNITY): Payer: Self-pay | Admitting: Emergency Medicine

## 2017-12-30 ENCOUNTER — Other Ambulatory Visit: Payer: Self-pay

## 2017-12-30 DIAGNOSIS — F17210 Nicotine dependence, cigarettes, uncomplicated: Secondary | ICD-10-CM | POA: Diagnosis not present

## 2017-12-30 DIAGNOSIS — G8921 Chronic pain due to trauma: Secondary | ICD-10-CM | POA: Diagnosis not present

## 2017-12-30 DIAGNOSIS — F1290 Cannabis use, unspecified, uncomplicated: Secondary | ICD-10-CM | POA: Diagnosis not present

## 2017-12-30 DIAGNOSIS — T50901A Poisoning by unspecified drugs, medicaments and biological substances, accidental (unintentional), initial encounter: Secondary | ICD-10-CM | POA: Diagnosis present

## 2017-12-30 DIAGNOSIS — Z79899 Other long term (current) drug therapy: Secondary | ICD-10-CM | POA: Diagnosis not present

## 2017-12-30 DIAGNOSIS — Z041 Encounter for examination and observation following transport accident: Secondary | ICD-10-CM | POA: Diagnosis not present

## 2017-12-30 LAB — RAPID URINE DRUG SCREEN, HOSP PERFORMED
Amphetamines: NOT DETECTED
Barbiturates: NOT DETECTED
Benzodiazepines: NOT DETECTED
Cocaine: NOT DETECTED
Opiates: NOT DETECTED
Tetrahydrocannabinol: NOT DETECTED

## 2017-12-30 LAB — CBC WITH DIFFERENTIAL/PLATELET
BASOS PCT: 0 %
Basophils Absolute: 0 10*3/uL (ref 0.0–0.1)
EOS ABS: 0.2 10*3/uL (ref 0.0–0.7)
EOS PCT: 2 %
HCT: 38 % (ref 36.0–46.0)
Hemoglobin: 12.3 g/dL (ref 12.0–15.0)
Lymphocytes Relative: 25 %
Lymphs Abs: 2.1 10*3/uL (ref 0.7–4.0)
MCH: 28.1 pg (ref 26.0–34.0)
MCHC: 32.4 g/dL (ref 30.0–36.0)
MCV: 86.8 fL (ref 78.0–100.0)
MONO ABS: 0.4 10*3/uL (ref 0.1–1.0)
MONOS PCT: 5 %
Neutro Abs: 5.9 10*3/uL (ref 1.7–7.7)
Neutrophils Relative %: 68 %
Platelets: 361 10*3/uL (ref 150–400)
RBC: 4.38 MIL/uL (ref 3.87–5.11)
RDW: 14.5 % (ref 11.5–15.5)
WBC: 8.7 10*3/uL (ref 4.0–10.5)

## 2017-12-30 LAB — COMPREHENSIVE METABOLIC PANEL WITH GFR
ALT: 21 U/L (ref 0–44)
AST: 18 U/L (ref 15–41)
Albumin: 4.2 g/dL (ref 3.5–5.0)
Alkaline Phosphatase: 65 U/L (ref 38–126)
Anion gap: 7 (ref 5–15)
BUN: 8 mg/dL (ref 6–20)
CO2: 23 mmol/L (ref 22–32)
Calcium: 9.9 mg/dL (ref 8.9–10.3)
Chloride: 112 mmol/L — ABNORMAL HIGH (ref 98–111)
Creatinine, Ser: 0.77 mg/dL (ref 0.44–1.00)
GFR calc Af Amer: >60 mL/min
GFR calc non Af Amer: >60 mL/min
Glucose, Bld: 82 mg/dL (ref 70–99)
Potassium: 3.7 mmol/L (ref 3.5–5.1)
Sodium: 142 mmol/L (ref 135–145)
Total Bilirubin: 0.5 mg/dL (ref 0.3–1.2)
Total Protein: 7.4 g/dL (ref 6.5–8.1)

## 2017-12-30 LAB — I-STAT BETA HCG BLOOD, ED (MC, WL, AP ONLY): I-stat hCG, quantitative: <5 m[IU]/mL

## 2017-12-30 LAB — ACETAMINOPHEN LEVEL: Acetaminophen (Tylenol), Serum: <10 ug/mL — ABNORMAL LOW (ref 10–30)

## 2017-12-30 LAB — ETHANOL: Alcohol, Ethyl (B): <10 mg/dL (ref <10)

## 2017-12-30 LAB — SALICYLATE LEVEL: Salicylate Lvl: <7 mg/dL (ref 2.8–30.0)

## 2017-12-30 MED ORDER — NALOXONE HCL 0.4 MG/ML IJ SOLN
0.4000 mg | Freq: Once | INTRAMUSCULAR | Status: AC
  Administered 2017-12-30: 0.4 mg via INTRAVENOUS
  Filled 2017-12-30: qty 1

## 2017-12-30 MED ORDER — SODIUM CHLORIDE 0.9 % IV BOLUS
1000.0000 mL | Freq: Once | INTRAVENOUS | Status: AC
  Administered 2017-12-30: 1000 mL via INTRAVENOUS
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
## 2017-12-30 NOTE — Discharge Instructions (Signed)
It was my pleasure taking care of you today!  ° °Follow up with your primary care doctor.  ° °Return to ER for new or worsening symptoms, any additional concerns.  °
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
## 2017-12-30 NOTE — ED Notes (Signed)
Patient more alert, talking to officer at this time.

## 2018-02-02 ENCOUNTER — Ambulatory Visit: Payer: Medicaid Other | Admitting: Physical Therapy

## 2018-09-15 ENCOUNTER — Other Ambulatory Visit (HOSPITAL_BASED_OUTPATIENT_CLINIC_OR_DEPARTMENT_OTHER): Payer: Self-pay

## 2018-09-15 DIAGNOSIS — G4730 Sleep apnea, unspecified: Secondary | ICD-10-CM

## 2018-09-19 ENCOUNTER — Ambulatory Visit (HOSPITAL_BASED_OUTPATIENT_CLINIC_OR_DEPARTMENT_OTHER): Payer: Medicaid Other | Admitting: Internal Medicine

## 2018-09-20 ENCOUNTER — Other Ambulatory Visit: Payer: Self-pay

## 2018-09-21 ENCOUNTER — Other Ambulatory Visit: Payer: Self-pay

## 2018-09-21 ENCOUNTER — Ambulatory Visit (HOSPITAL_BASED_OUTPATIENT_CLINIC_OR_DEPARTMENT_OTHER): Payer: Medicaid Other | Attending: Family Medicine | Admitting: Internal Medicine

## 2018-09-21 VITALS — Ht 66.0 in | Wt 236.0 lb

## 2018-09-21 DIAGNOSIS — G4733 Obstructive sleep apnea (adult) (pediatric): Secondary | ICD-10-CM

## 2018-09-21 DIAGNOSIS — G4730 Sleep apnea, unspecified: Secondary | ICD-10-CM | POA: Insufficient documentation

## 2018-09-26 ENCOUNTER — Encounter (HOSPITAL_BASED_OUTPATIENT_CLINIC_OR_DEPARTMENT_OTHER): Payer: Medicaid Other

## 2018-09-27 DIAGNOSIS — G4733 Obstructive sleep apnea (adult) (pediatric): Secondary | ICD-10-CM

## 2018-09-27 NOTE — Procedures (Signed)
   Patient Name: Christine Berry, Christine Berry Date: 09/22/2018 Gender: Female D.O.B: 1975/09/07 Age (years): 54 Referring Provider: Gean Maidens MD Height (inches): 72 Interpreting Physician: Baird Lyons MD, ABSM Weight (lbs): 236 RPSGT: Gerhard Perches BMI: 74 MRN: [id number] Neck Size: 15.00  CLINICAL INFORMATION Sleep Study Type: HST Indication for sleep study: OSA Epworth Sleepiness Score: 19  SLEEP STUDY TECHNIQUE A multi-channel overnight portable sleep study was performed. The channels recorded were: nasal airflow, thoracic respiratory movement, and oxygen saturation with a pulse oximetry. Snoring was also monitored.  MEDICATIONS Patient self administered medications include: None reported.  SLEEP ARCHITECTURE Patient was studied for 491.5 minutes. The sleep efficiency was 100.0 % and the patient was supine for 32.7%. The arousal index was 0.0 per hour.  RESPIRATORY PARAMETERS The overall AHI was 24.3 per hour, with a central apnea index of 0.0 per hour. The oxygen nadir was 65% during sleep.  CARDIAC DATA Mean heart rate during sleep was 71.8 bpm.  IMPRESSIONS - Moderate obstructive sleep apnea occurred during this study (AHI = 24.3/h). - No significant central sleep apnea occurred during this study (CAI = 0.0/h). - Oxygen desaturation was noted during this study (Min O2 = 65%). Mean sat 96%. - Patient snored.  DIAGNOSIS - Obstructive Sleep Apnea (327.23 [G47.33 ICD-10])  RECOMMENDATIONS - Suggest CPAP titration sleep study or autopap. Other options, including Sleep medical consultation, a fitted oral appliance, or ENT evaluation, would be based on clinical judgment. - Be careful with alcohol, sedatives and other CNS depressants that may worsen sleep apnea and disrupt normal sleep architecture. - Sleep hygiene should be reviewed to assess factors that may improve sleep quality. - Weight management and regular exercise should be initiated or continued.   [Electronically signed] 09/27/2018 01:59 PM  Baird Lyons MD, [place], American Board of Sleep Medicine   NPI: [id number]                          [place], [place] [facility]  ELECTRONICALLY SIGNED ON:  09/27/2018, 1:55 PM [place] PH: [phone]   FX: [phone] [place]
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
## 2020-05-22 ENCOUNTER — Ambulatory Visit: Payer: Medicaid Other | Attending: Family Medicine | Admitting: Physical Therapy

## 2020-06-13 ENCOUNTER — Ambulatory Visit: Payer: Medicaid Other | Attending: Family Medicine

## 2020-12-11 ENCOUNTER — Ambulatory Visit (INDEPENDENT_AMBULATORY_CARE_PROVIDER_SITE_OTHER): Payer: Medicaid Other

## 2020-12-11 ENCOUNTER — Other Ambulatory Visit: Payer: Self-pay

## 2020-12-11 ENCOUNTER — Encounter: Payer: Self-pay | Admitting: Podiatry

## 2020-12-11 ENCOUNTER — Ambulatory Visit (INDEPENDENT_AMBULATORY_CARE_PROVIDER_SITE_OTHER): Payer: Medicaid Other | Admitting: Podiatry

## 2020-12-11 DIAGNOSIS — M79672 Pain in left foot: Secondary | ICD-10-CM

## 2020-12-11 DIAGNOSIS — M778 Other enthesopathies, not elsewhere classified: Secondary | ICD-10-CM

## 2020-12-11 MED ORDER — TRIAMCINOLONE ACETONIDE 10 MG/ML IJ SUSP
10.0000 mg | Freq: Once | INTRAMUSCULAR | Status: AC
  Administered 2020-12-11: 10 mg

## 2020-12-11 NOTE — Progress Notes (Signed)
Subjective:   Patient ID: Christine Berry, female   DOB: 45 y.o.   MRN: [id number]   HPI Patient presents stating she is having pain on top of her left foot for the last several weeks and she states that it is in the midfoot area and does not remember specific injury.  Patient smokes a half a pack per day likes to be active and is moderately obese at this time   Review of Systems  All other systems reviewed and are negative.      Objective:  Physical Exam Vitals and nursing note reviewed.  Constitutional:      Appearance: She is well-developed.  Pulmonary:     Effort: Pulmonary effort is normal.  Musculoskeletal:        General: Normal range of motion.  Skin:    General: Skin is warm.  Neurological:     Mental Status: She is alert.    Neurovascular status found to be intact muscle strength found to be adequate range of motion adequate with moderate midfoot inflammation pain left dorsal.  There is no discomfort into the MPJs and it is mostly proximal to the metatarsal shafts and into the midfoot area.  Patient has good digital perfusion well oriented x3     Assessment:  Probability for acute extensor tendinitis left versus possibility for other unknown bone pathology or other condition     Plan:  H&P x-rays reviewed and I went ahead today did sterile prep and injected the dorsal tendon group 3 mg Kenalog 5 mg Xylocaine advised on ice therapy and she is getting get an air fracture walker that she has been ordered on [employer] for cost savings to try to reduce the inflammation pain she is experiencing.  If symptoms get worse she is to let us know right away  X-rays indicate that there is no signs of fracture stress fracture or other pathology on x-ray

## 2022-10-14 ENCOUNTER — Emergency Department (HOSPITAL_BASED_OUTPATIENT_CLINIC_OR_DEPARTMENT_OTHER): Payer: Medicaid Other

## 2022-10-14 ENCOUNTER — Encounter (HOSPITAL_BASED_OUTPATIENT_CLINIC_OR_DEPARTMENT_OTHER): Payer: Self-pay

## 2022-10-14 ENCOUNTER — Inpatient Hospital Stay (HOSPITAL_BASED_OUTPATIENT_CLINIC_OR_DEPARTMENT_OTHER)
Admission: EM | Admit: 2022-10-14 | Discharge: 2022-10-15 | DRG: 389 | Disposition: A | Discharge status: Home / Self Care | Payer: Medicaid Other | Attending: Surgery | Admitting: Surgery

## 2022-10-14 ENCOUNTER — Other Ambulatory Visit: Payer: Self-pay

## 2022-10-14 DIAGNOSIS — Z6841 Body Mass Index (BMI) 40.0 and over, adult: Secondary | ICD-10-CM | POA: Diagnosis not present

## 2022-10-14 DIAGNOSIS — M47817 Spondylosis without myelopathy or radiculopathy, lumbosacral region: Secondary | ICD-10-CM | POA: Diagnosis not present

## 2022-10-14 DIAGNOSIS — Z87891 Personal history of nicotine dependence: Secondary | ICD-10-CM

## 2022-10-14 DIAGNOSIS — Z79899 Other long term (current) drug therapy: Secondary | ICD-10-CM

## 2022-10-14 DIAGNOSIS — K56609 Unspecified intestinal obstruction, unspecified as to partial versus complete obstruction: Secondary | ICD-10-CM | POA: Diagnosis not present

## 2022-10-14 DIAGNOSIS — Z791 Long term (current) use of non-steroidal anti-inflammatories (NSAID): Secondary | ICD-10-CM | POA: Diagnosis not present

## 2022-10-14 DIAGNOSIS — Z7952 Long term (current) use of systemic steroids: Secondary | ICD-10-CM

## 2022-10-14 DIAGNOSIS — M47812 Spondylosis without myelopathy or radiculopathy, cervical region: Secondary | ICD-10-CM | POA: Diagnosis not present

## 2022-10-14 DIAGNOSIS — F419 Anxiety disorder, unspecified: Secondary | ICD-10-CM | POA: Diagnosis present

## 2022-10-14 DIAGNOSIS — K56600 Partial intestinal obstruction, unspecified as to cause: Principal | ICD-10-CM | POA: Diagnosis present

## 2022-10-14 DIAGNOSIS — K449 Diaphragmatic hernia without obstruction or gangrene: Secondary | ICD-10-CM | POA: Diagnosis present

## 2022-10-14 DIAGNOSIS — Z792 Long term (current) use of antibiotics: Secondary | ICD-10-CM

## 2022-10-14 LAB — COMPREHENSIVE METABOLIC PANEL
ALT: 29 U/L (ref 0–44)
AST: 33 U/L (ref 15–41)
Albumin: 4.5 g/dL (ref 3.5–5.0)
Alkaline Phosphatase: 74 U/L (ref 38–126)
Anion gap: 14 (ref 5–15)
BUN: 11 mg/dL (ref 6–20)
CO2: 23 mmol/L (ref 22–32)
Calcium: 9.7 mg/dL (ref 8.9–10.3)
Chloride: 104 mmol/L (ref 98–111)
Creatinine, Ser: 0.72 mg/dL (ref 0.44–1.00)
GFR, Estimated: >60 mL/min (ref >60)
Glucose, Bld: 137 mg/dL — ABNORMAL HIGH (ref 70–99)
Potassium: 4.1 mmol/L (ref 3.5–5.1)
Sodium: 141 mmol/L (ref 135–145)
Total Bilirubin: 0.6 mg/dL (ref 0.3–1.2)
Total Protein: 7.6 g/dL (ref 6.5–8.1)

## 2022-10-14 LAB — URINALYSIS, ROUTINE W REFLEX MICROSCOPIC
Bilirubin Urine: NEGATIVE
Glucose, UA: NEGATIVE mg/dL
Hgb urine dipstick: NEGATIVE
Ketones, ur: NEGATIVE mg/dL
Leukocytes,Ua: NEGATIVE
Nitrite: NEGATIVE
Protein, ur: 30 mg/dL — AB
Specific Gravity, Urine: 1.015 (ref 1.005–1.030)
pH: 8 (ref 5.0–8.0)

## 2022-10-14 LAB — LACTIC ACID, PLASMA
Lactic Acid, Venous: 2 mmol/L (ref 0.5–1.9)
Lactic Acid, Venous: 1.6 mmol/L (ref 0.5–1.9)

## 2022-10-14 LAB — URINALYSIS, MICROSCOPIC (REFLEX)

## 2022-10-14 LAB — CBC WITH DIFFERENTIAL/PLATELET
Abs Immature Granulocytes: 0.02 10*3/uL (ref 0.00–0.07)
Basophils Absolute: 0 10*3/uL (ref 0.0–0.1)
Basophils Relative: 0 %
Eosinophils Absolute: 0 10*3/uL (ref 0.0–0.5)
Eosinophils Relative: 0 %
HCT: 40.9 % (ref 36.0–46.0)
Hemoglobin: 13.3 g/dL (ref 12.0–15.0)
Immature Granulocytes: 0 %
Lymphocytes Relative: 13 %
Lymphs Abs: 1.3 10*3/uL (ref 0.7–4.0)
MCH: 28.2 pg (ref 26.0–34.0)
MCHC: 32.5 g/dL (ref 30.0–36.0)
MCV: 86.7 fL (ref 80.0–100.0)
Monocytes Absolute: 0.3 10*3/uL (ref 0.1–1.0)
Monocytes Relative: 3 %
Neutro Abs: 8.7 10*3/uL — ABNORMAL HIGH (ref 1.7–7.7)
Neutrophils Relative %: 84 %
Platelets: 225 10*3/uL (ref 150–400)
RBC: 4.72 MIL/uL (ref 3.87–5.11)
RDW: 13.3 % (ref 11.5–15.5)
WBC: 10.4 10*3/uL (ref 4.0–10.5)
nRBC: 0 % (ref 0.0–0.2)

## 2022-10-14 LAB — LIPASE, BLOOD: Lipase: 26 U/L (ref 11–51)

## 2022-10-14 LAB — HCG, QUANTITATIVE, PREGNANCY: hCG, Beta Chain, Quant, S: 1 m[IU]/mL (ref <5)

## 2022-10-14 MED ORDER — PANTOPRAZOLE SODIUM 40 MG IV SOLR
40.0000 mg | Freq: Every day | INTRAVENOUS | Status: DC
  Administered 2022-10-14: 40 mg via INTRAVENOUS
  Filled 2022-10-14: qty 10

## 2022-10-14 MED ORDER — SODIUM CHLORIDE 0.9 % IV BOLUS
1000.0000 mL | Freq: Once | INTRAVENOUS | Status: AC
  Administered 2022-10-14: 1000 mL via INTRAVENOUS

## 2022-10-14 MED ORDER — HYDRALAZINE HCL 20 MG/ML IJ SOLN: 10.0000 mg | INTRAMUSCULAR | Status: DC | PRN

## 2022-10-14 MED ORDER — METHOCARBAMOL 1000 MG/10ML IJ SOLN: 500.0000 mg | Freq: Three times a day (TID) | INTRAVENOUS | Status: DC | PRN

## 2022-10-14 MED ORDER — ENOXAPARIN SODIUM 40 MG/0.4ML IJ SOSY
40.0000 mg | PREFILLED_SYRINGE | INTRAMUSCULAR | Status: DC
  Administered 2022-10-14: 40 mg via SUBCUTANEOUS
  Filled 2022-10-14: qty 0.4

## 2022-10-14 MED ORDER — DIATRIZOATE MEGLUMINE & SODIUM 66-10 % PO SOLN
90.0000 mL | Freq: Once | ORAL | Status: AC
  Administered 2022-10-14: 90 mL via NASOGASTRIC
  Filled 2022-10-14: qty 90

## 2022-10-14 MED ORDER — ONDANSETRON HCL 4 MG/2ML IJ SOLN
4.0000 mg | Freq: Once | INTRAMUSCULAR | Status: AC
  Administered 2022-10-14: 4 mg via INTRAVENOUS
  Filled 2022-10-14: qty 2

## 2022-10-14 MED ORDER — SODIUM CHLORIDE 0.9 % IV SOLN: INTRAVENOUS | Status: AC

## 2022-10-14 MED ORDER — MORPHINE SULFATE (PF) 4 MG/ML IV SOLN
4.0000 mg | Freq: Once | INTRAVENOUS | Status: AC
  Administered 2022-10-14: 4 mg via INTRAVENOUS
  Filled 2022-10-14: qty 1

## 2022-10-14 MED ORDER — ONDANSETRON 4 MG PO TBDP: 4.0000 mg | ORAL_TABLET | Freq: Four times a day (QID) | ORAL | Status: DC | PRN

## 2022-10-14 MED ORDER — DIPHENHYDRAMINE HCL 25 MG PO CAPS: 25.0000 mg | ORAL_CAPSULE | Freq: Four times a day (QID) | ORAL | Status: DC | PRN

## 2022-10-14 MED ORDER — METOPROLOL TARTRATE 5 MG/5ML IV SOLN: 5.0000 mg | Freq: Four times a day (QID) | INTRAVENOUS | Status: DC | PRN

## 2022-10-14 MED ORDER — HYDROMORPHONE HCL 1 MG/ML IJ SOLN
0.5000 mg | INTRAMUSCULAR | Status: DC | PRN
  Administered 2022-10-14 – 2022-10-15 (×2): 0.5 mg via INTRAVENOUS
  Filled 2022-10-14 (×2): qty 1

## 2022-10-14 MED ORDER — METHOCARBAMOL 500 MG PO TABS: 500.0000 mg | ORAL_TABLET | Freq: Three times a day (TID) | ORAL | Status: DC | PRN

## 2022-10-14 MED ORDER — ONDANSETRON HCL 4 MG/2ML IJ SOLN
4.0000 mg | Freq: Four times a day (QID) | INTRAMUSCULAR | Status: DC | PRN
  Administered 2022-10-14: 4 mg via INTRAVENOUS
  Filled 2022-10-14: qty 2

## 2022-10-14 MED ORDER — DIPHENHYDRAMINE HCL 50 MG/ML IJ SOLN: 25.0000 mg | Freq: Four times a day (QID) | INTRAMUSCULAR | Status: DC | PRN

## 2022-10-14 MED ORDER — HYDROMORPHONE HCL 1 MG/ML IJ SOLN
1.0000 mg | Freq: Once | INTRAMUSCULAR | Status: AC
  Administered 2022-10-14: 1 mg via INTRAVENOUS
  Filled 2022-10-14: qty 1

## 2022-10-14 MED ORDER — IOHEXOL 300 MG/ML  SOLN
100.0000 mL | Freq: Once | INTRAMUSCULAR | Status: AC | PRN
  Administered 2022-10-14: 100 mL via INTRAVENOUS

## 2022-10-14 MED ORDER — NICOTINE 14 MG/24HR TD PT24
14.0000 mg | MEDICATED_PATCH | Freq: Once | TRANSDERMAL | Status: AC
  Administered 2022-10-14: 14 mg via TRANSDERMAL
  Filled 2022-10-14: qty 1

## 2022-10-14 NOTE — Progress Notes (Signed)
Patient came up from ED very upset with attempted NG tube placement. I called [facility] surgeon for [facility]) and told her patient concerns. Surgeon said to hold the NG tube and that she can drink the contrast needed instead.

## 2022-10-14 NOTE — ED Notes (Signed)
Pt requesting pain meds, Excell Seltzer PA notified.

## 2022-10-14 NOTE — Plan of Care (Signed)

## 2022-10-14 NOTE — ED Provider Notes (Signed)
[place] [facility] AT [facility] Provider Note   CSN: [id number] Arrival date & time: 10/14/22  8295     History  Chief Complaint  Patient presents with   Abdominal Pain    DJUANA LITTLETON is a 47 y.o. female.   Abdominal Pain   47 year old female presents emergency department with complaints of abdominal pain, nausea, vomiting.  Patient states that symptoms began yesterday after consuming a [employer] around 8 PM.  States that she was only person who consumed said food.  Reports able to tolerate some liquids as well as some vegetables earlier this morning briefly before vomiting again.  States she also tried a little bit of water mixed with charcoal which "to soothe your symptoms."  Reports pain diffusely across upper abdomen that seems to get better after bouts of emesis as well as when applying pressure.  Patient states that she used at home with [facility] on her abdomen which seem to improve her symptoms.  Denies fever, chills, chest pain, shortness of breath, urinary/vaginal symptoms, change in bowel habits.  Patient requesting CT scanning of her abdomen adamantly.  Past medical history significant for anxiety, drug abuse, spondylosis of cervical and lumbosacral region.  Home Medications Prior to Admission medications   Medication Sig Start Date End Date Taking? Authorizing Provider  ADDERALL XR 30 MG 24 hr capsule Take 30 mg by mouth daily.  10/25/17   [provider]  baclofen (LIORESAL) 10 MG tablet Take 1 tablet (10 mg total) by mouth 3 (three) times daily as needed for muscle spasms. Patient not taking: Reported on 12/30/2017 10/31/15   Porfirio Oar, PA  baclofen (LIORESAL) 10 MG tablet Take 10 mg by mouth daily as needed for muscle spasms.     [provider]  BELBUCA 75 MCG FILM Take 1 Film by mouth every 12 (twelve) hours.  10/22/17   [provider]  Biotin w/ Vitamins C & E 1250-7.5-7.5 MCG-MG-UNT CHEW Chew 1  tablet by mouth daily.    [provider]  busPIRone (BUSPAR) 10 MG tablet TK 1 T PO BID 11/24/17   [provider]  clonazePAM (KLONOPIN) 0.5 MG tablet Take 0.5 mg by mouth 2 (two) times daily as needed for anxiety.     [provider]  fluticasone (FLONASE) 50 MCG/ACT nasal spray Place 2 sprays into both nostrils at bedtime. 05/10/15   Sherren Mocha, MD  hydrOXYzine (ATARAX/VISTARIL) 25 MG tablet Take 25 mg by mouth daily.  11/14/17   [provider]  ibuprofen (ADVIL,MOTRIN) 800 MG tablet Take 1 tablet (800 mg total) by mouth every 6 (six) hours as needed. 10/03/14   Earley Favor, NP  ipratropium (ATROVENT) 0.03 % nasal spray Place 2 sprays into the nose 4 (four) times daily. 05/10/15   Sherren Mocha, MD  lidocaine-hydrocortisone [facility]) 3-0.5 % CREA Place 1 Applicatorful rectally daily.    [provider]  meloxicam (MOBIC) 15 MG tablet Take 15 mg by mouth daily.  10/15/17   [provider]  Multiple Vitamin (MULTIVITAMIN WITH MINERALS) TABS Take 1 tablet by mouth daily. Reported on 04/12/2015    [provider]  omeprazole (PRILOSEC) 40 MG capsule Take 40 mg by mouth daily.  11/12/17   [provider]  penicillin v potassium (VEETID) 500 MG tablet Take 1 tablet (500 mg total) by mouth 4 (four) times daily. 10/03/14   Earley Favor, NP  predniSONE (DELTASONE) 10 MG tablet TAKE 2 TABLETS(20 MG)  BY MOUTH DAILY WITH BREAKFAST 12/16/16   Nadara Mustard, MD  Prenatal Vit-Iron Carbonyl-FA (PNV TABS 29-1) 29-1 MG TABS Take 1 tablet by mouth daily.  11/14/17   [provider]  sertraline (ZOLOFT) 100 MG tablet Take 100 mg by mouth 2 (two) times daily.  11/14/17   [provider]  sertraline (ZOLOFT) 50 MG tablet Take 1 tablet (50 mg total) by mouth daily. Patient not taking: Reported on 12/30/2017 10/31/15   Sherren Mocha, MD  triamcinolone cream (KENALOG) 0.1 % Apply 1 application topically 2 (two) times daily. 04/12/15   Peyton Najjar, MD  Vitamin D, Ergocalciferol, (DRISDOL) 50000 units CAPS capsule Take 50,000 Units by mouth every 7 (seven) days.  11/14/17   [provider]  VOLTAREN 1 % GEL Apply 2 g topically 4 (four) times daily.  11/14/17   [provider]      Allergies    Patient has no known allergies.    Review of Systems   Review of Systems  Gastrointestinal:  Positive for abdominal pain.  All other systems reviewed and are negative.   Physical Exam Updated Vital Signs BP 124/86   Pulse 69   Temp 97.9 F (36.6 C) (Oral)   Resp 18   Ht 5\' 6"  (1.676 m)   Wt 112.9 kg   LMP 09/23/2022 (Approximate)   SpO2 93%   BMI 40.19 kg/m  Physical Exam Vitals and nursing note reviewed.  Constitutional:      General: She is not in acute distress.    Appearance: She is well-developed.  HENT:     Head: Normocephalic and atraumatic.  Eyes:     Conjunctiva/sclera: Conjunctivae normal.  Cardiovascular:     Rate and Rhythm: Normal rate and regular rhythm.     Heart sounds: No murmur heard. Pulmonary:     Effort: Pulmonary effort is normal. No respiratory distress.     Breath sounds: Normal breath sounds.  Abdominal:     Palpations: Abdomen is soft.     Tenderness: There is abdominal tenderness. There is no right CVA tenderness, left CVA tenderness or guarding. Negative signs include Murphy's sign and McBurney's sign.     Comments: Minimal epigastric tenderness to palpation.  Musculoskeletal:        General: No swelling.     Cervical back: Neck supple.  Skin:    General: Skin is warm and dry.     Capillary Refill: Capillary refill takes less than 2 seconds.  Neurological:     Mental Status: She is alert.  Psychiatric:        Mood and Affect: Mood normal.     ED Results / Procedures / Treatments   Labs (all labs ordered are listed, but only abnormal results are displayed) Labs Reviewed  COMPREHENSIVE METABOLIC PANEL - Abnormal; Notable for the following components:       Result Value   Glucose, Bld 137 (*)    All other components within normal limits  CBC WITH DIFFERENTIAL/PLATELET - Abnormal; Notable for the following components:   Neutro Abs 8.7 (*)    All other components within normal limits  LACTIC ACID, PLASMA - Abnormal; Notable for the following components:   Lactic Acid, Venous 2.0 (*)    All other components within normal limits  HCG, QUANTITATIVE, PREGNANCY  LIPASE, BLOOD  URINALYSIS, ROUTINE W REFLEX MICROSCOPIC  LACTIC ACID, PLASMA    EKG None  Radiology CT ABDOMEN PELVIS W CONTRAST  Result Date: 10/14/2022 CLINICAL  DATA:  Abdominal pain, vomiting EXAM: CT ABDOMEN AND PELVIS WITH CONTRAST TECHNIQUE: Multidetector CT imaging of the abdomen and pelvis was performed using the standard protocol following bolus administration of intravenous contrast. RADIATION DOSE REDUCTION: This exam was performed according to the departmental dose-optimization program which includes automated exposure control, adjustment of the mA and/or kV according to patient size and/or use of iterative reconstruction technique. CONTRAST:  OMNIPAQUE IOHEXOL 300 MG/ML  SOLN COMPARISON:  None Available. FINDINGS: Lower chest: Small linear density in right middle lobe may suggest scarring or subsegmental atelectasis. Hepatobiliary: No focal abnormalities are seen in the liver. There is no dilation of bile ducts. Gallbladder is unremarkable. Pancreas: There is prominence of pancreatic duct. No focal abnormalities are seen. Spleen: Unremarkable. Adrenals/Urinary Tract: Adrenals are unremarkable. There is no hydronephrosis. There is 8 mm calculus in the lower pole of left kidney. Urinary bladder is not distended. Stomach/Bowel: Small hiatal hernia is seen. Stomach is not distended. There is mild diffuse wall thickening in stomach. There is dilation of proximal small bowel loops measuring up to 3.7 cm in diameter. There is fecalization in dilated small bowel loops in mid abdomen.  Distal small bowel loops are decompressed. Exact level of transition is not clearly identified. There is no definite significant focal wall thickening. Appendix is not dilated. Colon is not distended. There is no pericolic stranding or fluid collection. Vascular/Lymphatic: Unremarkable. Reproductive: Unremarkable. Other: Trace amount of ascites is seen in pelvis. There is no pneumoperitoneum. Umbilical hernia containing fat is seen. Musculoskeletal: Degenerative changes are noted in lumbar spine at the L4-L5 level. IMPRESSION: There is abnormal dilation of proximal small bowel loops measuring up to 3.7 cm in diameter. Distal small bowel loops are decompressed. Exact level of transition is not clearly identified. Findings suggest high-grade partial small bowel obstruction, possibly due to internal hernia or adhesions. There is diffuse wall thickening in stomach which may be due to incomplete distention or suggest gastritis. Small hiatal hernia is seen. There is no hydronephrosis. There is 8 mm calculus in the left kidney. Trace amount of free fluid in pelvis may be due to physiological rupture of ovarian follicle or related to small-bowel obstruction. There is no pneumoperitoneum. Electronically Signed   By: Ernie Avena M.D.   On: 10/14/2022 11:54    Procedures Procedures    Medications Ordered in ED Medications  sodium chloride 0.9 % bolus 1,000 mL (0 mLs Intravenous Stopped 10/14/22 1153)  ondansetron (ZOFRAN) injection 4 mg (4 mg Intravenous Given 10/14/22 1010)  morphine (PF) 4 MG/ML injection 4 mg (4 mg Intravenous Given 10/14/22 1045)  iohexol (OMNIPAQUE) 300 MG/ML solution 100 mL (100 mLs Intravenous Contrast Given 10/14/22 1104)  HYDROmorphone (DILAUDID) injection 1 mg (1 mg Intravenous Given 10/14/22 1238)    ED Course/ Medical Decision Making/ A&P Clinical Course as of 10/14/22 1321  Wed Oct 14, 2022  1255 Consulted General Surgery PA-C Conner who recommended transfer ED to ED at [facility] for more time efficient evaluation given evidence of physiologic free fluid in the pelvis [CR]    Clinical Course User Index [CR] Peter Garter, PA                             Medical Decision Making Amount and/or Complexity of Data Reviewed Labs: ordered. Radiology: ordered.  Risk Prescription drug management.   This patient presents to the ED for concern of abdominal pain, this involves an extensive number  of treatment options, and is a complaint that carries with it a high risk of complications and morbidity.  The differential diagnosis includes gastroenteritis, pyelonephritis, nephrolithiasis, cystitis, ectopic pregnancy, ovarian torsion, tubo-ovarian abscess, gastritis, PUD, cholecystitis, CBD pathology, SBO/LBO, volvulus, diverticulitis, appendicitis   Co morbidities that complicate the patient evaluation  See HPI   Additional history obtained:  Additional history obtained from EMR External records from outside source obtained and reviewed including hospital records   Lab Tests:  I Ordered, and personally interpreted labs.  The pertinent results include: No leukocytosis noted.  No evidence of anemia.  Platelets within normal range.  No Electra abnormalities.  No transaminitis.  No renal dysfunction.  Lipase within normal limits.  Beta-hCG negative.  UA pending.  Initial lactic elevated 2.0   Imaging Studies ordered:  I ordered imaging studies including CT abdomen pelvis I independently visualized and interpreted imaging which showed abnormal dilation of proximal small bowel loops up to 3.7 cm.  Distal small bowel loops decompressed.  Findings consistent with high-grade partial small bowel obstruction.  Diffuse wall thickening of stomach.  Small hiatal hernia.  8 mm left renal calculus without evidence of hydronephrosis.  Trace amount of free fluid in pelvis. I agree with the radiologist interpretation  Cardiac Monitoring: / EKG:  The patient was maintained on  a cardiac monitor.  I personally viewed and interpreted the cardiac monitored which showed an underlying rhythm of: Sinus rhythm   Consultations Obtained:  See ED course  Problem List / ED Course / Critical interventions / Medication management  Small bowel obstruction I ordered medication including 1 L normal saline, Zofran, morphine, Dilaudid Reevaluation of the patient after these medicines showed that the patient improved I have reviewed the patients home medicines and have made adjustments as needed   Social Determinants of Health:  Former cigarette use.  Denies illicit drug use.   Test / Admission - Considered:  Small bowel obstruction Vitals signs within normal range and stable throughout visit. Laboratory/imaging studies significant for: See above 47 year old female presents emergency department with complaints of abdominal pain, emesis.  Patient with evidence of high-grade partial small bowel obstruction.  Given findings, general surgery consultation was made of which prefer transfer ED to ED for in person evaluation.  Will transfer patient to [facility] emergency department for bedside general surgery evaluation.  Patient stable upon transfer.        Final Clinical Impression(s) / ED Diagnoses Final diagnoses:  SBO (small bowel obstruction) [facility])    Rx / DC Orders ED Discharge Orders     None         Peter Garter, [place] 10/14/22 1322    Rolan Bucco, MD 10/14/22 1340

## 2022-10-14 NOTE — ED Triage Notes (Signed)
EMS reports: Patient came from [facility]. Bowel obstruction, Kidney stone. Abdominal pain.  BP 110/70 HR 70  02 96 %  Morphine was given at 12:00 pm  20 G in place.  Alert and oriented.   Surgery should be paged.

## 2022-10-14 NOTE — ED Notes (Signed)
Two nurse tried to inset NG tube and was unsuccessful, patient asked to stop.

## 2022-10-14 NOTE — ED Notes (Signed)
ED TO INPATIENT HANDOFF REPORT  Name/Age/Gender Christine Berry 47 y.o. female  Code Status    Code Status Orders  (From admission, onward)           Start     Ordered   10/14/22 1616  Full code  Continuous       Question:  By:  Answer:  Other   10/14/22 1617           Code Status History     This patient has a current code status but no historical code status.       Home/SNF/Other Home  Chief Complaint Small bowel obstruction (HCC) [K56.609]  Level of Care/Admitting Diagnosis ED Disposition     ED Disposition  Admit   Condition  --   Comment  Hospital Area: [facility] [[id number]]  Level of Care: Med-Surg [16]  May admit patient to [facility] or [facility] if equivalent level of care is available:: No  Covid Evaluation: Asymptomatic - no recent exposure (last 10 days) testing not required  Diagnosis: Small bowel obstruction [facility]) [[id number]]  Admitting Physician: CCS, MD [[id number]]  Attending Physician: CCS, MD [[id number]]  Certification:: I certify this patient will need inpatient services for at least 2 midnights  Estimated Length of Stay: 3          Medical History Past Medical History:  Diagnosis Date   Anxiety    Drug abuse (HCC)    Medical history non-contributory     Allergies No Known Allergies  IV Location/Drains/Wounds Patient Lines/Drains/Airways Status     Active Line/Drains/Airways     Name Placement date Placement time Site Days   Peripheral IV 10/14/22 20 G Posterior;Right Hand 10/14/22  1007  Hand  less than 1            Labs/Imaging Results for orders placed or performed during the hospital encounter of 10/14/22 (from the past 48 hour(s))  Comprehensive metabolic panel     Status: Abnormal   Collection Time: 10/14/22 10:05 AM  Result Value Ref Range   Sodium 141 135 - 145 mmol/L   Potassium 4.1 3.5 - 5.1 mmol/L   Chloride 104 98 - 111 mmol/L   CO2 23 22 - 32 mmol/L   Glucose, Bld 137 (H) 70 -  99 mg/dL    Comment: Glucose reference range applies only to samples taken after fasting for at least 8 hours.   BUN 11 6 - 20 mg/dL   Creatinine, Ser 0.45 0.44 - 1.00 mg/dL   Calcium 9.7 8.9 - 40.9 mg/dL   Total Protein 7.6 6.5 - 8.1 g/dL   Albumin 4.5 3.5 - 5.0 g/dL   AST 33 15 - 41 U/L   ALT 29 0 - 44 U/L   Alkaline Phosphatase 74 38 - 126 U/L   Total Bilirubin 0.6 0.3 - 1.2 mg/dL   GFR, Estimated >81 >19 mL/min    Comment: (NOTE) Calculated using the CKD-EPI Creatinine Equation (2021)    Anion gap 14 5 - 15    Comment: Performed at [facility], [address] [place], [place] [address]  CBC with Differential     Status: Abnormal   Collection Time: 10/14/22 10:05 AM  Result Value Ref Range   WBC 10.4 4.0 - 10.5 K/uL   RBC 4.72 3.87 - 5.11 MIL/uL   Hemoglobin 13.3 12.0 - 15.0 g/dL   HCT 95.6 21.3 - 08.6 %   MCV 86.7 80.0 - 100.0 fL  MCH 28.2 26.0 - 34.0 pg   MCHC 32.5 30.0 - 36.0 g/dL   RDW 25.3 66.4 - 40.3 %   Platelets 225 150 - 400 K/uL   nRBC 0.0 0.0 - 0.2 %   Neutrophils Relative % 84 %   Neutro Abs 8.7 (H) 1.7 - 7.7 K/uL   Lymphocytes Relative 13 %   Lymphs Abs 1.3 0.7 - 4.0 K/uL   Monocytes Relative 3 %   Monocytes Absolute 0.3 0.1 - 1.0 K/uL   Eosinophils Relative 0 %   Eosinophils Absolute 0.0 0.0 - 0.5 K/uL   Basophils Relative 0 %   Basophils Absolute 0.0 0.0 - 0.1 K/uL   Immature Granulocytes 0 %   Abs Immature Granulocytes 0.02 0.00 - 0.07 K/uL    Comment: Performed at [facility], [address] [place], [place] [address]  hCG, quantitative, pregnancy     Status: None   Collection Time: 10/14/22 10:05 AM  Result Value Ref Range   hCG, Beta Chain, Quant, S 1 <5 mIU/mL    Comment:          GEST. AGE      CONC.  (mIU/mL)   <=1 WEEK        5 - 50     2 WEEKS       50 - 500     3 WEEKS       100 - 10,000     4 WEEKS     1,000 - 30,000     5 WEEKS     3,500 - 115,000   6-8 WEEKS     12,000 - 270,000    12 WEEKS     15,000 -  220,000        FEMALE AND NON-PREGNANT FEMALE:     LESS THAN 5 mIU/mL Performed at [facility], [address] [place], [place] [address]   Lipase, blood     Status: None   Collection Time: 10/14/22 10:05 AM  Result Value Ref Range   Lipase 26 11 - 51 U/L    Comment: Performed at [facility], [address] [place], [place] [address]  Lactic acid, plasma     Status: Abnormal   Collection Time: 10/14/22 12:28 PM  Result Value Ref Range   Lactic Acid, Venous 2.0 (HH) 0.5 - 1.9 mmol/L    Comment: CRITICAL RESULT CALLED TO, READ BACK BY AND VERIFIED WITH A. NOAH RN 1245 10/14/22 MB Performed at [facility], [address] [place], [place] [address]   Lactic acid, plasma     Status: None   Collection Time: 10/14/22  2:21 PM  Result Value Ref Range   Lactic Acid, Venous 1.6 0.5 - 1.9 mmol/L    Comment: Performed at [facility], [address] [place], [place] [address]  Urinalysis, Routine w reflex microscopic -Urine, Clean Catch     Status: Abnormal   Collection Time: 10/14/22  2:53 PM  Result Value Ref Range   Color, Urine YELLOW YELLOW   APPearance CLEAR CLEAR   Specific Gravity, Urine 1.015 1.005 - 1.030   pH 8.0 5.0 - 8.0   Glucose, UA NEGATIVE NEGATIVE mg/dL   Hgb urine dipstick NEGATIVE NEGATIVE   Bilirubin Urine NEGATIVE NEGATIVE   Ketones, ur NEGATIVE NEGATIVE mg/dL   Protein, ur 30 (A) NEGATIVE mg/dL   Nitrite NEGATIVE NEGATIVE   Leukocytes,Ua NEGATIVE NEGATIVE  Comment: Performed at [facility], [address] [place], [place] [address]  Urinalysis, Microscopic (reflex)     Status: Abnormal   Collection Time: 10/14/22  2:53 PM  Result Value Ref Range   RBC / HPF 0-5 0 - 5 RBC/hpf   WBC, UA 0-5 0 - 5 WBC/hpf   Bacteria, UA RARE (A) NONE SEEN   Squamous Epithelial / HPF 0-5 0 - 5 /HPF    Comment: Performed at [facility], [address] [place], [place] [address]   CT ABDOMEN PELVIS W  CONTRAST  Result Date: 10/14/2022 CLINICAL DATA:  Abdominal pain, vomiting EXAM: CT ABDOMEN AND PELVIS WITH CONTRAST TECHNIQUE: Multidetector CT imaging of the abdomen and pelvis was performed using the standard protocol following bolus administration of intravenous contrast. RADIATION DOSE REDUCTION: This exam was performed according to the departmental dose-optimization program which includes automated exposure control, adjustment of the mA and/or kV according to patient size and/or use of iterative reconstruction technique. CONTRAST:  OMNIPAQUE IOHEXOL 300 MG/ML  SOLN COMPARISON:  None Available. FINDINGS: Lower chest: Small linear density in right middle lobe may suggest scarring or subsegmental atelectasis. Hepatobiliary: No focal abnormalities are seen in the liver. There is no dilation of bile ducts. Gallbladder is unremarkable. Pancreas: There is prominence of pancreatic duct. No focal abnormalities are seen. Spleen: Unremarkable. Adrenals/Urinary Tract: Adrenals are unremarkable. There is no hydronephrosis. There is 8 mm calculus in the lower pole of left kidney. Urinary bladder is not distended. Stomach/Bowel: Small hiatal hernia is seen. Stomach is not distended. There is mild diffuse wall thickening in stomach. There is dilation of proximal small bowel loops measuring up to 3.7 cm in diameter. There is fecalization in dilated small bowel loops in mid abdomen. Distal small bowel loops are decompressed. Exact level of transition is not clearly identified. There is no definite significant focal wall thickening. Appendix is not dilated. Colon is not distended. There is no pericolic stranding or fluid collection. Vascular/Lymphatic: Unremarkable. Reproductive: Unremarkable. Other: Trace amount of ascites is seen in pelvis. There is no pneumoperitoneum. Umbilical hernia containing fat is seen. Musculoskeletal: Degenerative changes are noted in lumbar spine at the L4-L5 level. IMPRESSION: There is  abnormal dilation of proximal small bowel loops measuring up to 3.7 cm in diameter. Distal small bowel loops are decompressed. Exact level of transition is not clearly identified. Findings suggest high-grade partial small bowel obstruction, possibly due to internal hernia or adhesions. There is diffuse wall thickening in stomach which may be due to incomplete distention or suggest gastritis. Small hiatal hernia is seen. There is no hydronephrosis. There is 8 mm calculus in the left kidney. Trace amount of free fluid in pelvis may be due to physiological rupture of ovarian follicle or related to small-bowel obstruction. There is no pneumoperitoneum. Electronically Signed   By: Ernie Avena M.D.   On: 10/14/2022 11:54    Pending Labs Unresulted Labs (From admission, onward)     Start     Ordered   10/15/22 0500  Basic metabolic panel  Tomorrow morning,   R        10/14/22 1617   10/15/22 0500  Magnesium  Tomorrow morning,   R        10/14/22 1617   10/15/22 0500  CBC  Tomorrow morning,   R        10/14/22 1617   10/14/22 1616  HIV Antibody (routine testing w rflx)  (HIV Antibody (Routine testing  w reflex) panel)  Once,   R        10/14/22 1617            Vitals/Pain Today's Vitals   10/14/22 1330 10/14/22 1436 10/14/22 1534 10/14/22 1545  BP: 107/72 120/73 98/82 130/73  Pulse: 61 67 63 68  Resp: 18 18 18 18   Temp:  98 F (36.7 C) 98 F (36.7 C)   TempSrc:  Oral Oral   SpO2: 97% 100% 96% 99%  Weight:      Height:      PainSc:        Isolation Precautions No active isolations  Medications Medications  nicotine (NICODERM CQ - dosed in mg/24 hours) patch 14 mg (14 mg Transdermal Patch Applied 10/14/22 1327)  enoxaparin (LOVENOX) injection 40 mg (has no administration in time range)  0.9 %  sodium chloride infusion (has no administration in time range)  metoprolol tartrate (LOPRESSOR) injection 5 mg (has no administration in time range)  hydrALAZINE (APRESOLINE) injection  10 mg (has no administration in time range)  pantoprazole (PROTONIX) injection 40 mg (has no administration in time range)  ondansetron (ZOFRAN-ODT) disintegrating tablet 4 mg (has no administration in time range)    Or  ondansetron (ZOFRAN) injection 4 mg (has no administration in time range)  diphenhydrAMINE (BENADRYL) capsule 25 mg (has no administration in time range)    Or  diphenhydrAMINE (BENADRYL) injection 25 mg (has no administration in time range)  methocarbamol (ROBAXIN) tablet 500 mg (has no administration in time range)    Or  methocarbamol (ROBAXIN) 500 mg in dextrose 5 % 50 mL IVPB (has no administration in time range)  HYDROmorphone (DILAUDID) injection 0.5-1 mg (has no administration in time range)  diatrizoate meglumine-sodium (GASTROGRAFIN) 66-10 % solution 90 mL (has no administration in time range)  sodium chloride 0.9 % bolus 1,000 mL (0 mLs Intravenous Stopped 10/14/22 1153)  ondansetron (ZOFRAN) injection 4 mg (4 mg Intravenous Given 10/14/22 1010)  morphine (PF) 4 MG/ML injection 4 mg (4 mg Intravenous Given 10/14/22 1045)  iohexol (OMNIPAQUE) 300 MG/ML solution 100 mL (100 mLs Intravenous Contrast Given 10/14/22 1104)  HYDROmorphone (DILAUDID) injection 1 mg (1 mg Intravenous Given 10/14/22 1238)    Mobility walks

## 2022-10-14 NOTE — ED Triage Notes (Signed)
C/o abdominal pain and vomiting after eating [employer] yesterday. Unable to tolerate PO.

## 2022-10-14 NOTE — ED Notes (Signed)
Patient transported to CT 

## 2022-10-14 NOTE — Consult Note (Incomplete)
Christine Berry 03-Jul-1975  [id number].    Requesting MD: Sherian Maroon, PA-C Chief Complaint/Reason for Consult: SBO  HPI: Christine Berry is a 47 y.o. who presented to [facility] ED for abdominal pain.  Patient reports she consumed Dione [employer]over around 8 PM yesterday and shortly after began having upper abdominal pain with associated nausea and vomiting.    Last Colonoscopy: *** Prior Abdominal Surgeries: *** Blood Thinners: *** Last PO intake: *** Allergies: *** Tobacco Use: *** Alcohol Use: *** Substance use: ***  Employment: ***  ***Denies a history of MI, CVA, asthma, or COPD. At baseline states he mobilizes without an assistive device and can climb a flight of stairs without stopping due to DOE or chest pain.  ROS: ROS  History reviewed. No pertinent family history.  Past Medical History:  Diagnosis Date   Anxiety    Drug abuse [facility])    Medical history non-contributory     Past Surgical History:  Procedure Laterality Date   CESAREAN SECTION      Social History:  reports that she has quit smoking. Her smoking use included cigarettes. She smoked an average of .5 packs per day. She has never used smokeless tobacco. She reports current alcohol use of about 3.0 standard drinks of alcohol per week. She reports that she does not use drugs.  Allergies: No Known Allergies  (Not in a hospital admission)    Physical Exam: Blood pressure 124/86, pulse 69, temperature 97.9 F (36.6 C), temperature source Oral, resp. rate 18, height 5\' 6"  (1.676 m), weight 112.9 kg, last menstrual period 09/23/2022, SpO2 93 %. ***  Results for orders placed or performed during the hospital encounter of 10/14/22 (from the past 48 hour(s))  Comprehensive metabolic panel     Status: Abnormal   Collection Time: 10/14/22 10:05 AM  Result Value Ref Range   Sodium 141 135 - 145 mmol/L   Potassium 4.1 3.5 - 5.1 mmol/L   Chloride 104 98 - 111 mmol/L   CO2 23 22 - 32  mmol/L   Glucose, Bld 137 (H) 70 - 99 mg/dL    Comment: Glucose reference range applies only to samples taken after fasting for at least 8 hours.   BUN 11 6 - 20 mg/dL   Creatinine, Ser 6.96 0.44 - 1.00 mg/dL   Calcium 9.7 8.9 - 29.5 mg/dL   Total Protein 7.6 6.5 - 8.1 g/dL   Albumin 4.5 3.5 - 5.0 g/dL   AST 33 15 - 41 U/L   ALT 29 0 - 44 U/L   Alkaline Phosphatase 74 38 - 126 U/L   Total Bilirubin 0.6 0.3 - 1.2 mg/dL   GFR, Estimated >28 >41 mL/min    Comment: (NOTE) Calculated using the CKD-EPI Creatinine Equation (2021)    Anion gap 14 5 - 15    Comment: Performed at [facility], [address] [place], [place] [address]  CBC with Differential     Status: Abnormal   Collection Time: 10/14/22 10:05 AM  Result Value Ref Range   WBC 10.4 4.0 - 10.5 K/uL   RBC 4.72 3.87 - 5.11 MIL/uL   Hemoglobin 13.3 12.0 - 15.0 g/dL   HCT 10.2 72.5 - 36.6 %   MCV 86.7 80.0 - 100.0 fL   MCH 28.2 26.0 - 34.0 pg   MCHC 32.5 30.0 - 36.0 g/dL   RDW 44.0 34.7 - 42.5 %   Platelets 225 150 - 400 K/uL  nRBC 0.0 0.0 - 0.2 %   Neutrophils Relative % 84 %   Neutro Abs 8.7 (H) 1.7 - 7.7 K/uL   Lymphocytes Relative 13 %   Lymphs Abs 1.3 0.7 - 4.0 K/uL   Monocytes Relative 3 %   Monocytes Absolute 0.3 0.1 - 1.0 K/uL   Eosinophils Relative 0 %   Eosinophils Absolute 0.0 0.0 - 0.5 K/uL   Basophils Relative 0 %   Basophils Absolute 0.0 0.0 - 0.1 K/uL   Immature Granulocytes 0 %   Abs Immature Granulocytes 0.02 0.00 - 0.07 K/uL    Comment: Performed at [facility], [address] [place], [place] [address]  hCG, quantitative, pregnancy     Status: None   Collection Time: 10/14/22 10:05 AM  Result Value Ref Range   hCG, Beta Chain, Quant, S 1 <5 mIU/mL    Comment:          GEST. AGE      CONC.  (mIU/mL)   <=1 WEEK        5 - 50     2 WEEKS       50 - 500     3 WEEKS       100 - 10,000     4 WEEKS     1,000 - 30,000     5 WEEKS     3,500 - 115,000   6-8 WEEKS     12,000 -  270,000    12 WEEKS     15,000 - 220,000        FEMALE AND NON-PREGNANT FEMALE:     LESS THAN 5 mIU/mL Performed at [facility], [address] [place], [place] [address]   Lipase, blood     Status: None   Collection Time: 10/14/22 10:05 AM  Result Value Ref Range   Lipase 26 11 - 51 U/L    Comment: Performed at [facility], [address] [place], [place] [address]  Lactic acid, plasma     Status: Abnormal   Collection Time: 10/14/22 12:28 PM  Result Value Ref Range   Lactic Acid, Venous 2.0 (HH) 0.5 - 1.9 mmol/L    Comment: CRITICAL RESULT CALLED TO, READ BACK BY AND VERIFIED WITH Antonietta Jewel RN 1245 10/14/22 MB Performed at [facility], [address] [place], [place] [address]    CT ABDOMEN PELVIS W CONTRAST  Result Date: 10/14/2022 CLINICAL DATA:  Abdominal pain, vomiting EXAM: CT ABDOMEN AND PELVIS WITH CONTRAST TECHNIQUE: Multidetector CT imaging of the abdomen and pelvis was performed using the standard protocol following bolus administration of intravenous contrast. RADIATION DOSE REDUCTION: This exam was performed according to the departmental dose-optimization program which includes automated exposure control, adjustment of the mA and/or kV according to patient size and/or use of iterative reconstruction technique. CONTRAST:  OMNIPAQUE IOHEXOL 300 MG/ML  SOLN COMPARISON:  None Available. FINDINGS: Lower chest: Small linear density in right middle lobe may suggest scarring or subsegmental atelectasis. Hepatobiliary: No focal abnormalities are seen in the liver. There is no dilation of bile ducts. Gallbladder is unremarkable. Pancreas: There is prominence of pancreatic duct. No focal abnormalities are seen. Spleen: Unremarkable. Adrenals/Urinary Tract: Adrenals are unremarkable. There is no hydronephrosis. There is 8 mm calculus in the lower pole of left kidney. Urinary bladder is not distended. Stomach/Bowel: Small hiatal hernia is seen. Stomach  is not distended. There is mild diffuse wall thickening in  stomach. There is dilation of proximal small bowel loops measuring up to 3.7 cm in diameter. There is fecalization in dilated small bowel loops in mid abdomen. Distal small bowel loops are decompressed. Exact level of transition is not clearly identified. There is no definite significant focal wall thickening. Appendix is not dilated. Colon is not distended. There is no pericolic stranding or fluid collection. Vascular/Lymphatic: Unremarkable. Reproductive: Unremarkable. Other: Trace amount of ascites is seen in pelvis. There is no pneumoperitoneum. Umbilical hernia containing fat is seen. Musculoskeletal: Degenerative changes are noted in lumbar spine at the L4-L5 level. IMPRESSION: There is abnormal dilation of proximal small bowel loops measuring up to 3.7 cm in diameter. Distal small bowel loops are decompressed. Exact level of transition is not clearly identified. Findings suggest high-grade partial small bowel obstruction, possibly due to internal hernia or adhesions. There is diffuse wall thickening in stomach which may be due to incomplete distention or suggest gastritis. Small hiatal hernia is seen. There is no hydronephrosis. There is 8 mm calculus in the left kidney. Trace amount of free fluid in pelvis may be due to physiological rupture of ovarian follicle or related to small-bowel obstruction. There is no pneumoperitoneum. Electronically Signed   By: Ernie Avena M.D.   On: 10/14/2022 11:54    Anti-infectives (From admission, onward)    None       Assessment/Plan ***   FEN - *** VTE - *** ID - *** Foley -  Dispo - Admit to ***.   I reviewed {Reviewed data:26882::"last 24 h vitals and pain scores","last 48 h intake and output","last 24 h labs and trends","last 24 h imaging results"}.  This care required {MDM levels:26883} level of medical decision making.   Jacinto Halim, [facility]  [facility]gery 10/14/2022, 1:06 PM Please see Amion for pager number during day hours 7:00am-4:30pm

## 2022-10-14 NOTE — H&P (Signed)
Surgical Evaluation  Chief Complaint: Small bowel obstruction  HPI: 47 year old woman with no known medical problems and surgical history of 1 C-section who presents with a 1 day history of abdominal pain, nausea and vomiting.  This began yesterday evening after eating Dione Plover as well as some carrots.  Shortly after the Dione Plover she had worsening nausea and crampy diffuse abdominal pain and reports multiple episodes of emesis in the last 20 hours.  She was able to try and tolerate some Gatorade, and tried multiple at home remedies including charcoal mixed with water, magnesium citrate, massaging her stomach, etc. as she also felt constipated.  The pain was in the upper abdomen, relieved with emesis.  Denies any fevers, reports her last bowel movement was just before onset of symptoms and was soft and normal but green in color.  He does note of a longstanding (several months) history of postprandial bloating and digestive issues.  No unintended weight loss and in fact has been gaining weight.  Her brother died last year and her son died in 06/19/22 and she has gained weight with each of these losses.  She has never had a colonoscopy. She reports her pain is well-controlled with IV medication currently.  No Known Allergies  Past Medical History:  Diagnosis Date   Anxiety    Drug abuse [facility])    Medical history non-contributory     Past Surgical History:  Procedure Laterality Date   CESAREAN SECTION      History reviewed. No pertinent family history.  Social History   Socioeconomic History   Marital status: Single    Spouse name: Not on file   Number of children: Not on file   Years of education: Not on file   Highest education level: Not on file  Occupational History   Not on file  Tobacco Use   Smoking status: Former    Packs/day: .5    Types: Cigarettes   Smokeless tobacco: Never  Vaping Use   Vaping Use: Some days  Substance and Sexual Activity   Alcohol use: Yes     Alcohol/week: 3.0 standard drinks of alcohol    Types: 3 Cans of beer per week   Drug use: No    Types: Marijuana   Sexual activity: Yes    Birth control/protection: Condom  Other Topics Concern   Not on file  Social History Narrative   Not on file   Social Determinants of Health   Financial Resource Strain: Not on file  Food Insecurity: Not on file  Transportation Needs: Not on file  Physical Activity: Not on file  Stress: Not on file  Social Connections: Not on file    No current facility-administered medications on file prior to encounter.   Current Outpatient Medications on File Prior to Encounter  Medication Sig Dispense Refill   ADDERALL XR 30 MG 24 hr capsule Take 30 mg by mouth daily.   0   baclofen (LIORESAL) 10 MG tablet Take 1 tablet (10 mg total) by mouth 3 (three) times daily as needed for muscle spasms. (Patient not taking: Reported on 12/30/2017) 90 tablet 0   baclofen (LIORESAL) 10 MG tablet Take 10 mg by mouth daily as needed for muscle spasms.      BELBUCA 75 MCG FILM Take 1 Film by mouth every 12 (twelve) hours.   0   Biotin w/ Vitamins C & E 1250-7.5-7.5 MCG-MG-UNT CHEW Chew 1 tablet by mouth daily.     busPIRone (BUSPAR) 10 MG  tablet TK 1 T PO BID  0   clonazePAM (KLONOPIN) 0.5 MG tablet Take 0.5 mg by mouth 2 (two) times daily as needed for anxiety.      fluticasone (FLONASE) 50 MCG/ACT nasal spray Place 2 sprays into both nostrils at bedtime. 16 g 2   hydrOXYzine (ATARAX/VISTARIL) 25 MG tablet Take 25 mg by mouth daily.   3   ibuprofen (ADVIL,MOTRIN) 800 MG tablet Take 1 tablet (800 mg total) by mouth every 6 (six) hours as needed. 30 tablet 0   ipratropium (ATROVENT) 0.03 % nasal spray Place 2 sprays into the nose 4 (four) times daily. 30 mL 1   lidocaine-hydrocortisone (ANAMANTEL HC) 3-0.5 % CREA Place 1 Applicatorful rectally daily.     meloxicam (MOBIC) 15 MG tablet Take 15 mg by mouth daily.   2   Multiple Vitamin (MULTIVITAMIN WITH MINERALS) TABS Take  1 tablet by mouth daily. Reported on 04/12/2015     omeprazole (PRILOSEC) 40 MG capsule Take 40 mg by mouth daily.   1   penicillin v potassium (VEETID) 500 MG tablet Take 1 tablet (500 mg total) by mouth 4 (four) times daily. 39 tablet 0   predniSONE (DELTASONE) 10 MG tablet TAKE 2 TABLETS(20 MG) BY MOUTH DAILY WITH BREAKFAST 60 tablet 0   Prenatal Vit-Iron Carbonyl-FA (PNV TABS 29-1) 29-1 MG TABS Take 1 tablet by mouth daily.   3   sertraline (ZOLOFT) 100 MG tablet Take 100 mg by mouth 2 (two) times daily.   2   sertraline (ZOLOFT) 50 MG tablet Take 1 tablet (50 mg total) by mouth daily. (Patient not taking: Reported on 12/30/2017) 30 tablet 0   triamcinolone cream (KENALOG) 0.1 % Apply 1 application topically 2 (two) times daily. 30 g 0   Vitamin D, Ergocalciferol, (DRISDOL) 50000 units CAPS capsule Take 50,000 Units by mouth every 7 (seven) days.   3   VOLTAREN 1 % GEL Apply 2 g topically 4 (four) times daily.   2    Review of Systems: a complete, 10pt review of systems was completed with pertinent positives and negatives as documented in the HPI  Physical Exam: Vitals:   10/14/22 1534 10/14/22 1545  BP: 98/82 130/73  Pulse: 63 68  Resp: 18 18  Temp: 98 F (36.7 C)   SpO2: 96% 99%   Gen: A&Ox3, no distress  Chest: respiratory effort is normal.  Cardiovascular: RRR with palpable distal pulses, no pedal edema Gastrointestinal: soft, obese, nondistended, currently completely nontender Muscoloskeletal: no clubbing or cyanosis of the fingers.  Strength is symmetrical throughout.  Range of motion of bilateral upper and lower extremities normal without pain, crepitation or contracture. Neuro: cranial nerves grossly intact.  Sensation intact to light touch diffusely. Psych: appropriate mood and affect, normal insight/judgment intact  Skin: warm and dry      Latest Ref Rng & Units 10/14/2022   10:05 AM 12/30/2017    6:08 PM 04/23/2015    1:49 PM  CBC  WBC 4.0 - 10.5 K/uL 10.4  8.7  9.6    Hemoglobin 12.0 - 15.0 g/dL 62.9  52.8  41.3   Hematocrit 36.0 - 46.0 % 40.9  38.0  37.1   Platelets 150 - 400 K/uL 225  361  290        Latest Ref Rng & Units 10/14/2022   10:05 AM 12/30/2017    6:08 PM 06/24/2009   10:54 PM  CMP  Glucose 70 - 99 mg/dL 244  82  93  BUN 6 - 20 mg/dL 11  8  5    Creatinine 0.44 - 1.00 mg/dL 0.98  1.19  0.8   Sodium 135 - 145 mmol/L 141  142  139   Potassium 3.5 - 5.1 mmol/L 4.1  3.7  3.7   Chloride 98 - 111 mmol/L 104  112  107   CO2 22 - 32 mmol/L 23  23    Calcium 8.9 - 10.3 mg/dL 9.7  9.9    Total Protein 6.5 - 8.1 g/dL 7.6  7.4    Total Bilirubin 0.3 - 1.2 mg/dL 0.6  0.5    Alkaline Phos 38 - 126 U/L 74  65    AST 15 - 41 U/L 33  18    ALT 0 - 44 U/L 29  21      No results found for: "INR", "PROTIME"  Imaging: CT ABDOMEN PELVIS W CONTRAST  Result Date: 10/14/2022 CLINICAL DATA:  Abdominal pain, vomiting EXAM: CT ABDOMEN AND PELVIS WITH CONTRAST TECHNIQUE: Multidetector CT imaging of the abdomen and pelvis was performed using the standard protocol following bolus administration of intravenous contrast. RADIATION DOSE REDUCTION: This exam was performed according to the departmental dose-optimization program which includes automated exposure control, adjustment of the mA and/or kV according to patient size and/or use of iterative reconstruction technique. CONTRAST:  OMNIPAQUE IOHEXOL 300 MG/ML  SOLN COMPARISON:  None Available. FINDINGS: Lower chest: Small linear density in right middle lobe may suggest scarring or subsegmental atelectasis. Hepatobiliary: No focal abnormalities are seen in the liver. There is no dilation of bile ducts. Gallbladder is unremarkable. Pancreas: There is prominence of pancreatic duct. No focal abnormalities are seen. Spleen: Unremarkable. Adrenals/Urinary Tract: Adrenals are unremarkable. There is no hydronephrosis. There is 8 mm calculus in the lower pole of left kidney. Urinary bladder is not distended.  Stomach/Bowel: Small hiatal hernia is seen. Stomach is not distended. There is mild diffuse wall thickening in stomach. There is dilation of proximal small bowel loops measuring up to 3.7 cm in diameter. There is fecalization in dilated small bowel loops in mid abdomen. Distal small bowel loops are decompressed. Exact level of transition is not clearly identified. There is no definite significant focal wall thickening. Appendix is not dilated. Colon is not distended. There is no pericolic stranding or fluid collection. Vascular/Lymphatic: Unremarkable. Reproductive: Unremarkable. Other: Trace amount of ascites is seen in pelvis. There is no pneumoperitoneum. Umbilical hernia containing fat is seen. Musculoskeletal: Degenerative changes are noted in lumbar spine at the L4-L5 level. IMPRESSION: There is abnormal dilation of proximal small bowel loops measuring up to 3.7 cm in diameter. Distal small bowel loops are decompressed. Exact level of transition is not clearly identified. Findings suggest high-grade partial small bowel obstruction, possibly due to internal hernia or adhesions. There is diffuse wall thickening in stomach which may be due to incomplete distention or suggest gastritis. Small hiatal hernia is seen. There is no hydronephrosis. There is 8 mm calculus in the left kidney. Trace amount of free fluid in pelvis may be due to physiological rupture of ovarian follicle or related to small-bowel obstruction. There is no pneumoperitoneum. Electronically Signed   By: Ernie Avena M.D.   On: 10/14/2022 11:54     A/P: 47 year old woman with partial small bowel obstruction with fecalization suggesting chronic component, no overt transition point.  She is afebrile, no tachycardia, normotensive, with a normal lactate, no leukocytosis/AKI/acidosis and a very benign abdominal exam. She is currently still nauseated.  We will  proceed with nasogastric tube decompression followed by small bowel obstruction  protocol.  IV fluids and symptom directed supportive care.   Patient Active Problem List   Diagnosis Date Noted   Small bowel obstruction (HCC) 10/14/2022   Spondylosis without myelopathy or radiculopathy, lumbosacral region 06/11/2016   Spondylosis without myelopathy or radiculopathy, cervical region 06/11/2016       Phylliss Blakes, MD [facility] Surgery  See AMION to contact appropriate on-call provider   [facility]dm-[facility]

## 2022-10-14 NOTE — ED Notes (Signed)
[facility] at bedside 

## 2022-10-15 ENCOUNTER — Inpatient Hospital Stay (HOSPITAL_COMMUNITY): Payer: Medicaid Other

## 2022-10-15 LAB — CBC
HCT: 36 % (ref 36.0–46.0)
Hemoglobin: 11.3 g/dL — ABNORMAL LOW (ref 12.0–15.0)
MCH: 28.8 pg (ref 26.0–34.0)
MCHC: 31.4 g/dL (ref 30.0–36.0)
MCV: 91.6 fL (ref 80.0–100.0)
Platelets: 250 10*3/uL (ref 150–400)
RBC: 3.93 MIL/uL (ref 3.87–5.11)
RDW: 13.4 % (ref 11.5–15.5)
WBC: 8.8 10*3/uL (ref 4.0–10.5)
nRBC: 0 % (ref 0.0–0.2)

## 2022-10-15 LAB — BASIC METABOLIC PANEL
Anion gap: 9 (ref 5–15)
BUN: 11 mg/dL (ref 6–20)
CO2: 22 mmol/L (ref 22–32)
Calcium: 8.5 mg/dL — ABNORMAL LOW (ref 8.9–10.3)
Chloride: 107 mmol/L (ref 98–111)
Creatinine, Ser: 0.75 mg/dL (ref 0.44–1.00)
GFR, Estimated: >60 mL/min (ref >60)
Glucose, Bld: 97 mg/dL (ref 70–99)
Potassium: 3.3 mmol/L — ABNORMAL LOW (ref 3.5–5.1)
Sodium: 138 mmol/L (ref 135–145)

## 2022-10-15 LAB — HIV ANTIBODY (ROUTINE TESTING W REFLEX): HIV Screen 4th Generation wRfx: NONREACTIVE

## 2022-10-15 LAB — MAGNESIUM: Magnesium: 2.1 mg/dL (ref 1.7–2.4)

## 2022-10-15 MED ORDER — DOCUSATE SODIUM 100 MG PO CAPS
100.0000 mg | ORAL_CAPSULE | Freq: Two times a day (BID) | ORAL | Status: DC
  Administered 2022-10-15: 100 mg via ORAL
  Filled 2022-10-15: qty 1

## 2022-10-15 MED ORDER — ALBUTEROL SULFATE (2.5 MG/3ML) 0.083% IN NEBU: 3.0000 mL | INHALATION_SOLUTION | Freq: Four times a day (QID) | RESPIRATORY_TRACT | Status: DC | PRN

## 2022-10-15 MED ORDER — ACETAMINOPHEN 325 MG PO TABS: 650.0000 mg | ORAL_TABLET | Freq: Four times a day (QID) | ORAL | Status: DC | PRN

## 2022-10-15 NOTE — Progress Notes (Signed)
Subjective/Chief Complaint: Feels much better this morning.  Multiple bowel movements.  No pain.  Has already tried some clear liquids without any issues.   Objective: Vital signs in last 24 hours: Temp:  [97.6 F (36.4 C)-98.1 F (36.7 C)] 97.8 F (36.6 C) (07/11 0611) Pulse Rate:  [59-75] 64 (07/11 0611) Resp:  [14-18] 16 (07/11 0611) BP: (98-135)/(55-88) 103/76 (07/11 0611) SpO2:  [93 %-100 %] 96 % (07/11 0611) FiO2 (%):  [21 %] 21 % (07/11 0055) Weight:  [112.9 kg] 112.9 kg (07/10 0956) Last BM Date : 10/13/22  Intake/Output from previous day: 07/10 0701 - 07/11 0700 In: 2430.1 [P.O.:90; I.V.:1500; IV Piggyback:840.1] Out: -  Intake/Output this shift: No intake/output data recorded.  Alert well-appearing Unlabored respirations Abdomen is soft, nontender, nondistended  Lab Results:  Recent Labs    10/14/22 1005 10/15/22 0356  WBC 10.4 8.8  HGB 13.3 11.3*  HCT 40.9 36.0  PLT 225 250   BMET Recent Labs    10/14/22 1005 10/15/22 0356  NA 141 138  K 4.1 3.3*  CL 104 107  CO2 23 22  GLUCOSE 137* 97  BUN 11 11  CREATININE 0.72 0.75  CALCIUM 9.7 8.5*   PT/INR No results for input(s): "LABPROT", "INR" in the last 72 hours. ABG No results for input(s): "PHART", "HCO3" in the last 72 hours.  Invalid input(s): "PCO2", "PO2"  Studies/Results: DG Abd Portable 1V-Small Bowel Obstruction Protocol-initial, 8 hr delay  Result Date: 10/15/2022 CLINICAL DATA:  47 year old female with abdominal pain, vomiting, CT Abdomen and Pelvis yesterday suspicious for small bowel obstruction. 8 hours status post oral contrast administration. EXAM: PORTABLE ABDOMEN - 1 VIEW COMPARISON:  CT Abdomen and Pelvis 1110 hours yesterday. FINDINGS: AP view of the abdomen at 0402 hours demonstrates oral contrast throughout the large bowel to the rectosigmoid colon. Decreased small bowel gas since the CT yesterday, and currently no dilated small bowel loops. Stable visualized osseous  structures. IMPRESSION: Normalized bowel gas pattern since the CT yesterday. And oral contrast has reached the rectum. Electronically Signed   By: Odessa Fleming M.D.   On: 10/15/2022 04:35   CT ABDOMEN PELVIS W CONTRAST  Result Date: 10/14/2022 CLINICAL DATA:  Abdominal pain, vomiting EXAM: CT ABDOMEN AND PELVIS WITH CONTRAST TECHNIQUE: Multidetector CT imaging of the abdomen and pelvis was performed using the standard protocol following bolus administration of intravenous contrast. RADIATION DOSE REDUCTION: This exam was performed according to the departmental dose-optimization program which includes automated exposure control, adjustment of the mA and/or kV according to patient size and/or use of iterative reconstruction technique. CONTRAST:  OMNIPAQUE IOHEXOL 300 MG/ML  SOLN COMPARISON:  None Available. FINDINGS: Lower chest: Small linear density in right middle lobe may suggest scarring or subsegmental atelectasis. Hepatobiliary: No focal abnormalities are seen in the liver. There is no dilation of bile ducts. Gallbladder is unremarkable. Pancreas: There is prominence of pancreatic duct. No focal abnormalities are seen. Spleen: Unremarkable. Adrenals/Urinary Tract: Adrenals are unremarkable. There is no hydronephrosis. There is 8 mm calculus in the lower pole of left kidney. Urinary bladder is not distended. Stomach/Bowel: Small hiatal hernia is seen. Stomach is not distended. There is mild diffuse wall thickening in stomach. There is dilation of proximal small bowel loops measuring up to 3.7 cm in diameter. There is fecalization in dilated small bowel loops in mid abdomen. Distal small bowel loops are decompressed. Exact level of transition is not clearly identified. There is no definite significant focal wall thickening. Appendix is  not dilated. Colon is not distended. There is no pericolic stranding or fluid collection. Vascular/Lymphatic: Unremarkable. Reproductive: Unremarkable. Other: Trace amount of  ascites is seen in pelvis. There is no pneumoperitoneum. Umbilical hernia containing fat is seen. Musculoskeletal: Degenerative changes are noted in lumbar spine at the L4-L5 level. IMPRESSION: There is abnormal dilation of proximal small bowel loops measuring up to 3.7 cm in diameter. Distal small bowel loops are decompressed. Exact level of transition is not clearly identified. Findings suggest high-grade partial small bowel obstruction, possibly due to internal hernia or adhesions. There is diffuse wall thickening in stomach which may be due to incomplete distention or suggest gastritis. Small hiatal hernia is seen. There is no hydronephrosis. There is 8 mm calculus in the left kidney. Trace amount of free fluid in pelvis may be due to physiological rupture of ovarian follicle or related to small-bowel obstruction. There is no pneumoperitoneum. Electronically Signed   By: Ernie Avena M.D.   On: 10/14/2022 11:54    Anti-infectives: Anti-infectives (From admission, onward)    None       Assessment/Plan:  Partial SBO, clinically and radiographically resolving.  Clear liquids, advance as tolerated full liquids.  Anticipate discharge home later today.   LOS: 1 day    Berna Bue 10/15/2022

## 2022-10-15 NOTE — Discharge Summary (Signed)
    Patient ID: Christine Berry [id number] 06-Mar-1976 47 y.o.  Admit date: 10/14/2022 Discharge date: 10/15/2022  Discharge Diagnosis pSBO  Consultants None  Reason for Admission: 47 year old woman with no known medical problems and surgical history of 1 C-section who presents with a 1 day history of abdominal pain, nausea and vomiting.  This began yesterday evening after eating Dione Plover as well as some carrots.  Shortly after the Dione Plover she had worsening nausea and crampy diffuse abdominal pain and reports multiple episodes of emesis in the last 20 hours.  She was able to try and tolerate some Gatorade, and tried multiple at home remedies including charcoal mixed with water, magnesium citrate, massaging her stomach, etc. as she also felt constipated.  The pain was in the upper abdomen, relieved with emesis.  Denies any fevers, reports her last bowel movement was just before onset of symptoms and was soft and normal but green in color.  He does note of a longstanding (several months) history of postprandial bloating and digestive issues.  No unintended weight loss and in fact has been gaining weight.  Her brother died last year and her son died in 2022/06/21 and she has gained weight with each of these losses.  She has never had a colonoscopy. She reports her pain is well-controlled with IV medication currently.  Procedures None  Hospital Course:  Patient was admitted as above. Her sbo clinically and radiographically resolved. Her diet was advanced and tolerated. She was felt stable for discharge on 7/11.    I was not directly involved in this patient's care and did not see the patient during their hospital stay, therefore the information in this discharge summary was taken entirely from the chart.  Allergies as of 10/15/2022   No Known Allergies      Medication List     STOP taking these medications    ibuprofen 800 MG tablet Commonly known as: ADVIL       TAKE these  medications    baclofen 10 MG tablet Commonly known as: LIORESAL Take 1 tablet (10 mg total) by mouth 3 (three) times daily as needed for muscle spasms.   fluticasone 50 MCG/ACT nasal spray Commonly known as: FLONASE Place 2 sprays into both nostrils at bedtime.   ipratropium 0.03 % nasal spray Commonly known as: ATROVENT Place 2 sprays into the nose 4 (four) times daily.   penicillin v potassium 500 MG tablet Commonly known as: VEETID Take 1 tablet (500 mg total) by mouth 4 (four) times daily.   predniSONE 10 MG tablet Commonly known as: DELTASONE TAKE 2 TABLETS(20 MG) BY MOUTH DAILY WITH BREAKFAST   sertraline 50 MG tablet Commonly known as: ZOLOFT Take 1 tablet (50 mg total) by mouth daily.   triamcinolone cream 0.1 % Commonly known as: KENALOG Apply 1 application topically 2 (two) times daily.   TYLENOL 500 MG tablet Generic drug: acetaminophen Take 500-1,000 mg by mouth every 6 (six) hours as needed for mild pain or headache.          Follow-up Information     Center, [facility] Follow up.   Contact information: [address] [place] [address] [phone]                 Signed: Leary Roca, [facility] 10/15/2022, 2:12 PM Please see Amion for pager number during day hours 7:00am-4:30pm

## 2022-10-15 NOTE — Progress Notes (Signed)
   10/15/22 0055  BiPAP/CPAP/SIPAP  $ Face Mask Medium Yes  BiPAP/CPAP/SIPAP Pt Type Adult  BiPAP/CPAP/SIPAP DREAMSTATIOND  Mask Type Nasal mask  Mask Size Medium  FiO2 (%) 21 %  Patient Home Equipment No  Auto Titrate Yes (5-20)

## 2022-10-15 NOTE — Progress Notes (Signed)
   10/15/22 0943  TOC Brief Assessment  Insurance and Status Reviewed  Patient has primary care physician Yes  Home environment has been reviewed Resides with spouse  Prior level of function: Independent at baseline  Prior/Current Home Services No current home services  Social Determinants of Health Reivew SDOH reviewed no interventions necessary  Readmission risk has been reviewed Yes  Transition of care needs no transition of care needs at this time

## 2022-10-18 ENCOUNTER — Telehealth: Payer: Medicaid Other

## 2022-10-18 ENCOUNTER — Encounter: Payer: Self-pay | Admitting: Physician Assistant

## 2022-10-30 ENCOUNTER — Ambulatory Visit: Payer: Medicaid Other | Admitting: Podiatry

## 2022-11-05 ENCOUNTER — Ambulatory Visit (INDEPENDENT_AMBULATORY_CARE_PROVIDER_SITE_OTHER): Payer: Medicaid Other | Admitting: Podiatry

## 2022-11-05 DIAGNOSIS — Z91199 Patient's noncompliance with other medical treatment and regimen due to unspecified reason: Secondary | ICD-10-CM

## 2022-11-05 NOTE — Progress Notes (Signed)
No show for pt.

## 2022-11-11 ENCOUNTER — Ambulatory Visit: Payer: Medicaid Other | Admitting: Podiatry

## 2022-12-22 ENCOUNTER — Ambulatory Visit: Payer: Medicaid Other | Attending: Sports Medicine

## 2023-01-05 ENCOUNTER — Encounter (HOSPITAL_BASED_OUTPATIENT_CLINIC_OR_DEPARTMENT_OTHER): Payer: Self-pay | Admitting: Emergency Medicine

## 2023-01-05 ENCOUNTER — Emergency Department (HOSPITAL_BASED_OUTPATIENT_CLINIC_OR_DEPARTMENT_OTHER)
Admission: EM | Admit: 2023-01-05 | Discharge: 2023-01-05 | Discharge status: Against Medical Advice | Payer: Medicaid Other | Attending: Emergency Medicine | Admitting: Emergency Medicine

## 2023-01-05 ENCOUNTER — Other Ambulatory Visit: Payer: Self-pay

## 2023-01-05 DIAGNOSIS — Z5321 Procedure and treatment not carried out due to patient leaving prior to being seen by health care provider: Secondary | ICD-10-CM | POA: Diagnosis not present

## 2023-01-05 DIAGNOSIS — R0602 Shortness of breath: Secondary | ICD-10-CM | POA: Insufficient documentation

## 2023-01-05 DIAGNOSIS — T63301A Toxic effect of unspecified spider venom, accidental (unintentional), initial encounter: Secondary | ICD-10-CM | POA: Diagnosis present

## 2023-01-05 DIAGNOSIS — R109 Unspecified abdominal pain: Secondary | ICD-10-CM | POA: Diagnosis not present

## 2023-01-05 HISTORY — DX: Unspecified intestinal obstruction, unspecified as to partial versus complete obstruction: K56.609

## 2023-01-05 LAB — COMPREHENSIVE METABOLIC PANEL
ALT: 27 U/L (ref 0–44)
AST: 28 U/L (ref 15–41)
Albumin: 4.3 g/dL (ref 3.5–5.0)
Alkaline Phosphatase: 69 U/L (ref 38–126)
Anion gap: 13 (ref 5–15)
BUN: 11 mg/dL (ref 6–20)
CO2: 22 mmol/L (ref 22–32)
Calcium: 9.4 mg/dL (ref 8.9–10.3)
Chloride: 106 mmol/L (ref 98–111)
Creatinine, Ser: 0.79 mg/dL (ref 0.44–1.00)
GFR, Estimated: >60 mL/min (ref >60)
Glucose, Bld: 122 mg/dL — ABNORMAL HIGH (ref 70–99)
Potassium: 3.6 mmol/L (ref 3.5–5.1)
Sodium: 141 mmol/L (ref 135–145)
Total Bilirubin: 0.3 mg/dL (ref 0.3–1.2)
Total Protein: 7.2 g/dL (ref 6.5–8.1)

## 2023-01-05 LAB — LIPASE, BLOOD: Lipase: 32 U/L (ref 11–51)

## 2023-01-05 NOTE — ED Triage Notes (Signed)
Pt POV c/o spider bite a week ago to R upper thigh that is not healing.   Also reports concern for abd pain, recently admitted for SBO. C/o intermittent abd pain, denies n/v. LBM today, small amount.   C/o feeling heart racing today.

## 2023-01-06 ENCOUNTER — Telehealth: Payer: Medicaid Other | Admitting: Physician Assistant

## 2023-01-06 DIAGNOSIS — Z91199 Patient's noncompliance with other medical treatment and regimen due to unspecified reason: Secondary | ICD-10-CM

## 2023-01-06 NOTE — Progress Notes (Signed)
The patient no-showed for appointment despite this provider sending direct links with no response and waiting for at least 10 minutes from appointment time for patient to join. They will be marked as a NS for this appointment/time.   Piedad Climes, PA-C

## 2023-03-10 ENCOUNTER — Ambulatory Visit: Payer: Medicaid Other | Attending: Sports Medicine | Admitting: Physical Therapy

## 2023-03-10 NOTE — Therapy (Unsigned)
OUTPATIENT PHYSICAL THERAPY LOWER EXTREMITY EVALUATION   Patient Name: Christine Berry MRN: [id number] DOB:04/05/1976, 47 y.o., female Today's Date: 03/10/2023  END OF SESSION:   Past Medical History:  Diagnosis Date   Anxiety    Drug abuse [facility])    Medical history non-contributory    SBO (small bowel obstruction) (HCC)    Past Surgical History:  Procedure Laterality Date   CESAREAN SECTION     Patient Active Problem List   Diagnosis Date Noted   Small bowel obstruction (HCC) 10/14/2022   Spondylosis without myelopathy or radiculopathy, lumbosacral region 06/11/2016   Spondylosis without myelopathy or radiculopathy, cervical region 06/11/2016    PCP: [facility]  REFERRING PROVIDER: Delfin Gant, MD   REFERRING DIAG:  [phone] (ICD-10-CM) - Pain in right foot  [phone] (ICD-10-CM) - Pain in left foot    THERAPY DIAG:  No diagnosis found.  Rationale for Evaluation and Treatment: Rehabilitation  ONSET DATE: 11/26/22  SUBJECTIVE:   SUBJECTIVE STATEMENT: ***  PERTINENT HISTORY: PMHx: anxiety, drug abuse, SBO 7/24 Per referring physician note: B post heel pain lasting months, worsening, aggravated by WBing and exercise, no N & T, yes stiffness PAIN:  Are you having pain? {OPRCPAIN:27236}  PRECAUTIONS: {Therapy precautions:24002}  WEIGHT BEARING RESTRICTIONS: {Yes ***/No:24003}  FALLS:  Has patient fallen in last 6 months? {fallsyesno:27318}  LIVING ENVIRONMENT: Lives with: {OPRC lives with:25569::"lives with their family"} Lives in: {Lives in:25570} Stairs: {opstairs:27293} Has following equipment at home: {Assistive devices:23999}  OCCUPATION: Engineer, production  PLOF: {PLOF:24004}  PATIENT GOALS: ***  NEXT MD VISIT: ***  OBJECTIVE:  Note: Objective measures were completed at Evaluation unless otherwise noted.  DIAGNOSTIC FINDINGS: ***  PATIENT SURVEYS:  {rehab surveys:24030}  COGNITION: Overall cognitive status:  {cognition:24006}     SENSATION: {sensation:27233}  EDEMA:  {edema:24020}  MUSCLE LENGTH: Hamstrings: Right *** deg; Left *** deg Maisie Fus test: Right *** deg; Left *** deg  POSTURE: {posture:25561}  PALPATION: ***  LOWER EXTREMITY ROM:  {AROM/PROM:27142} ROM Right eval Left eval  Hip flexion    Hip extension    Hip abduction    Hip adduction    Hip internal rotation    Hip external rotation    Knee flexion    Knee extension    Ankle dorsiflexion    Ankle plantarflexion    Ankle inversion    Ankle eversion     (Blank rows = not tested)  LOWER EXTREMITY MMT:  MMT Right eval Left eval  Hip flexion    Hip extension    Hip abduction    Hip adduction    Hip internal rotation    Hip external rotation    Knee flexion    Knee extension    Ankle dorsiflexion    Ankle plantarflexion    Ankle inversion    Ankle eversion     (Blank rows = not tested)  LOWER EXTREMITY SPECIAL TESTS:  {LEspecialtests:26242}  FUNCTIONAL TESTS:  {Functional tests:24029}  GAIT: Distance walked: *** Assistive device utilized: {Assistive devices:23999} Level of assistance: {Levels of assistance:24026} Comments: ***   TODAY'S TREATMENT:  DATE:  03/10/23  Education  PATIENT EDUCATION:  Education details: POC Person educated: Patient Education method: Explanation Education comprehension: verbalized understanding  HOME EXERCISE PROGRAM: ***  ASSESSMENT:  CLINICAL IMPRESSION: Patient is a 47 y.o. who was seen today for physical therapy evaluation and treatment for B posterior heel pain which is exacerbated with WBing or exercise. ***.   OBJECTIVE IMPAIRMENTS: Abnormal gait, decreased activity tolerance, decreased balance, decreased mobility, difficulty walking, decreased ROM, decreased strength, postural dysfunction, and pain.   ACTIVITY LIMITATIONS:  standing, squatting, stairs, and locomotion level  PARTICIPATION LIMITATIONS: meal prep, cleaning, laundry, shopping, community activity, occupation, and yard work  PERSONAL FACTORS: Past/current experiences are also affecting patient's functional outcome.   REHAB POTENTIAL: Good  CLINICAL DECISION MAKING: Stable/uncomplicated  EVALUATION COMPLEXITY: Low   GOALS: Goals reviewed with patient? Yes  SHORT TERM GOALS: Target date: 03/26/23 I with initial HEP Baseline: Goal status: INITIAL  2.  *** Baseline:  Goal status: INITIAL  3.  *** Baseline:  Goal status: INITIAL  4.  *** Baseline:  Goal status: INITIAL  5.  *** Baseline:  Goal status: INITIAL  6.  *** Baseline:  Goal status: INITIAL  LONG TERM GOALS: Target date: ***  I with final HEP Baseline:  Goal status: INITIAL  2.  *** Baseline:  Goal status: INITIAL  3.  *** Baseline:  Goal status: INITIAL  4.  *** Baseline:  Goal status: INITIAL  5.  *** Baseline:  Goal status: INITIAL  6.  *** Baseline:  Goal status: INITIAL   PLAN:  PT FREQUENCY: 2x/week  PT DURATION: 10 weeks  PLANNED INTERVENTIONS: {rehab planned interventions:25118::"97110-Therapeutic exercises","97530- Therapeutic [phone]- Neuromuscular re-education","97535- Self ZHYQ","65784- Manual therapy"}  PLAN FOR NEXT SESSION: ***   Iona Beard, DPT 03/10/2023, 10:33 AM

## 2023-03-11 ENCOUNTER — Emergency Department (HOSPITAL_BASED_OUTPATIENT_CLINIC_OR_DEPARTMENT_OTHER)
Admission: EM | Admit: 2023-03-11 | Discharge: 2023-03-11 | Discharge status: Against Medical Advice | Payer: Medicaid Other

## 2023-03-11 ENCOUNTER — Other Ambulatory Visit: Payer: Self-pay

## 2023-03-11 ENCOUNTER — Encounter (HOSPITAL_BASED_OUTPATIENT_CLINIC_OR_DEPARTMENT_OTHER): Payer: Self-pay | Admitting: Emergency Medicine

## 2023-03-11 DIAGNOSIS — K644 Residual hemorrhoidal skin tags: Secondary | ICD-10-CM | POA: Diagnosis not present

## 2023-03-11 DIAGNOSIS — K625 Hemorrhage of anus and rectum: Secondary | ICD-10-CM | POA: Diagnosis present

## 2023-03-11 LAB — CBC
HCT: 38.6 % (ref 36.0–46.0)
Hemoglobin: 12.2 g/dL (ref 12.0–15.0)
MCH: 28.2 pg (ref 26.0–34.0)
MCHC: 31.6 g/dL (ref 30.0–36.0)
MCV: 89.1 fL (ref 80.0–100.0)
Platelets: 223 10*3/uL (ref 150–400)
RBC: 4.33 MIL/uL (ref 3.87–5.11)
RDW: 13.2 % (ref 11.5–15.5)
WBC: 7.8 10*3/uL (ref 4.0–10.5)
nRBC: 0 % (ref 0.0–0.2)

## 2023-03-11 LAB — BASIC METABOLIC PANEL
Anion gap: 8 (ref 5–15)
BUN: 25 mg/dL — ABNORMAL HIGH (ref 6–20)
CO2: 26 mmol/L (ref 22–32)
Calcium: 8.7 mg/dL — ABNORMAL LOW (ref 8.9–10.3)
Chloride: 104 mmol/L (ref 98–111)
Creatinine, Ser: 0.79 mg/dL (ref 0.44–1.00)
GFR, Estimated: >60 mL/min (ref >60)
Glucose, Bld: 112 mg/dL — ABNORMAL HIGH (ref 70–99)
Potassium: 3.8 mmol/L (ref 3.5–5.1)
Sodium: 138 mmol/L (ref 135–145)

## 2023-03-11 NOTE — ED Triage Notes (Addendum)
Bright rectal bleed with straining today , last BM today , she said she passed hard stools. No abd pain

## 2023-03-11 NOTE — ED Notes (Signed)
Pt reports that she must leave to pick up her child.  Pt informed that she would be leaving Against Medical Advice.  Pt verbalized understanding.

## 2023-03-12 NOTE — ED Provider Notes (Signed)
[place] [facility] Provider Note   CSN: [id number] Arrival date & time: 03/11/23  1711     History  Chief Complaint  Patient presents with   Rectal Bleeding    Christine Berry is a 47 y.o. female.  Is a 47 year old female presenting emergency department with blood in stool.  States that she has been constipated.  Took suppository.  Had large bowel movement with straining today.  Notes that she had some bright red blood in the toilet.  No lightheadedness dizziness abdominal pain.  No blood thinners.  States she feels her normal self.   Rectal Bleeding      Home Medications Prior to Admission medications   Medication Sig Start Date End Date Taking? Authorizing Provider  baclofen (LIORESAL) 10 MG tablet Take 1 tablet (10 mg total) by mouth 3 (three) times daily as needed for muscle spasms. Patient not taking: Reported on 12/30/2017 10/31/15   Porfirio Oar, PA  fluticasone (FLONASE) 50 MCG/ACT nasal spray Place 2 sprays into both nostrils at bedtime. Patient not taking: Reported on 10/14/2022 05/10/15   Sherren Mocha, MD  ipratropium (ATROVENT) 0.03 % nasal spray Place 2 sprays into the nose 4 (four) times daily. Patient not taking: Reported on 10/14/2022 05/10/15   Sherren Mocha, MD  penicillin v potassium (VEETID) 500 MG tablet Take 1 tablet (500 mg total) by mouth 4 (four) times daily. Patient not taking: Reported on 10/14/2022 10/03/14   Earley Favor, NP  predniSONE (DELTASONE) 10 MG tablet TAKE 2 TABLETS(20 MG) BY MOUTH DAILY WITH BREAKFAST Patient not taking: Reported on 10/14/2022 12/16/16   Nadara Mustard, MD  sertraline (ZOLOFT) 50 MG tablet Take 1 tablet (50 mg total) by mouth daily. Patient not taking: Reported on 12/30/2017 10/31/15   Sherren Mocha, MD  triamcinolone cream (KENALOG) 0.1 % Apply 1 application topically 2 (two) times daily. Patient not taking: Reported on 10/14/2022 04/12/15   Peyton Najjar, MD  TYLENOL 500 MG tablet Take  500-1,000 mg by mouth every 6 (six) hours as needed for mild pain or headache.    [provider]      Allergies    Patient has no known allergies.    Review of Systems   Review of Systems  Gastrointestinal:  Positive for hematochezia.    Physical Exam Updated Vital Signs BP (!) 117/50 (BP Location: Right Arm)   Pulse 87   Temp 98.5 F (36.9 C) (Oral)   Resp 16   Wt 117.5 kg   LMP 02/19/2023 (Exact Date)   SpO2 100%   BMI 41.80 kg/m  Physical Exam Vitals and nursing note reviewed. Exam conducted with a chaperone present.  Constitutional:      General: She is not in acute distress.    Appearance: She is obese. She is not toxic-appearing.  HENT:     Head: Normocephalic.     Nose: Nose normal.     Mouth/Throat:     Mouth: Mucous membranes are moist.  Eyes:     Conjunctiva/sclera: Conjunctivae normal.  Cardiovascular:     Rate and Rhythm: Normal rate and regular rhythm.  Pulmonary:     Effort: Pulmonary effort is normal.     Breath sounds: Normal breath sounds.  Abdominal:     General: There is no distension.     Tenderness: There is no abdominal tenderness. There is no guarding or rebound.  Genitourinary:    Rectum: Normal.  Comments: External hemorrhoids appreciated.  No active bleeding. Musculoskeletal:     Right lower leg: No edema.     Left lower leg: No edema.  Skin:    General: Skin is warm and dry.     Capillary Refill: Capillary refill takes less than 2 seconds.  Neurological:     Mental Status: She is alert and oriented to person, place, and time.  Psychiatric:        Mood and Affect: Mood normal.        Behavior: Behavior normal.     ED Results / Procedures / Treatments   Labs (all labs ordered are listed, but only abnormal results are displayed) Labs Reviewed  BASIC METABOLIC PANEL - Abnormal; Notable for the following components:      Result Value   Glucose, Bld 112 (*)    BUN 25 (*)    Calcium 8.7 (*)    All other components  within normal limits  CBC    EKG None  Radiology No results found.  Procedures Procedures    Medications Ordered in ED Medications - No data to display  ED Course/ Medical Decision Making/ A&P                                 Medical Decision Making 47 year old female presenting with blood in stool after constipation.  Afebrile vital signs reassuring.  Does have some external hemorrhoids on exam, no active bleeding.  Ordered basic labs to evaluate for H&H level as well as renal function/BUN/creatinine.  Patient's hemoglobin returned normal.  Does have mildly elevated BUN, but normal creatinine.  No evidence of metabolic derangements.  Patient left AMA from the emergency department prior to reevaluation.  Suspect bleeding secondary to her constipation.  We had discussed on initial evaluation stool softeners.  And GI follow-up.  Amount and/or Complexity of Data Reviewed Labs: ordered.          Final Clinical Impression(s) / ED Diagnoses Final diagnoses:  None    Rx / DC Orders ED Discharge Orders     None         Coral Spikes, DO 03/12/23 0008

## 2023-03-12 NOTE — ED Provider Notes (Incomplete)
[place] [facility] Provider Note   CSN: [id number] Arrival date & time: 03/11/23  1711     History {Add pertinent medical, surgical, social history, OB history to HPI:1} Chief Complaint  Patient presents with  . Rectal Bleeding    Christine Berry is a 47 y.o. female.  Is a 47 year old female presenting emergency department with blood in stool.  States that she has been constipated.  Took suppository.  Had large bowel movement with straining today.  Notes that she had some bright red blood in the toilet.  No lightheadedness dizziness abdominal pain.  No blood thinners.  States she feels her normal self.   Rectal Bleeding      Home Medications Prior to Admission medications   Medication Sig Start Date End Date Taking? Authorizing Provider  baclofen (LIORESAL) 10 MG tablet Take 1 tablet (10 mg total) by mouth 3 (three) times daily as needed for muscle spasms. Patient not taking: Reported on 12/30/2017 10/31/15   Porfirio Oar, PA  fluticasone (FLONASE) 50 MCG/ACT nasal spray Place 2 sprays into both nostrils at bedtime. Patient not taking: Reported on 10/14/2022 05/10/15   Sherren Mocha, MD  ipratropium (ATROVENT) 0.03 % nasal spray Place 2 sprays into the nose 4 (four) times daily. Patient not taking: Reported on 10/14/2022 05/10/15   Sherren Mocha, MD  penicillin v potassium (VEETID) 500 MG tablet Take 1 tablet (500 mg total) by mouth 4 (four) times daily. Patient not taking: Reported on 10/14/2022 10/03/14   Earley Favor, NP  predniSONE (DELTASONE) 10 MG tablet TAKE 2 TABLETS(20 MG) BY MOUTH DAILY WITH BREAKFAST Patient not taking: Reported on 10/14/2022 12/16/16   Nadara Mustard, MD  sertraline (ZOLOFT) 50 MG tablet Take 1 tablet (50 mg total) by mouth daily. Patient not taking: Reported on 12/30/2017 10/31/15   Sherren Mocha, MD  triamcinolone cream (KENALOG) 0.1 % Apply 1 application topically 2 (two) times daily. Patient not taking: Reported on  10/14/2022 04/12/15   Peyton Najjar, MD  TYLENOL 500 MG tablet Take 500-1,000 mg by mouth every 6 (six) hours as needed for mild pain or headache.    [provider]      Allergies    Patient has no known allergies.    Review of Systems   Review of Systems  Gastrointestinal:  Positive for hematochezia.    Physical Exam Updated Vital Signs BP (!) 117/50 (BP Location: Right Arm)   Pulse 87   Temp 98.5 F (36.9 C) (Oral)   Resp 16   Wt 117.5 kg   LMP 02/19/2023 (Exact Date)   SpO2 100%   BMI 41.80 kg/m  Physical Exam Vitals and nursing note reviewed. Exam conducted with a chaperone present.  Constitutional:      General: She is not in acute distress.    Appearance: She is obese. She is not toxic-appearing.  HENT:     Head: Normocephalic.     Nose: Nose normal.     Mouth/Throat:     Mouth: Mucous membranes are moist.  Eyes:     Conjunctiva/sclera: Conjunctivae normal.  Cardiovascular:     Rate and Rhythm: Normal rate and regular rhythm.  Pulmonary:     Effort: Pulmonary effort is normal.     Breath sounds: Normal breath sounds.  Abdominal:     General: There is no distension.     Tenderness: There is no abdominal tenderness. There is no guarding or rebound.  Genitourinary:  Rectum: Normal.     Comments: External hemorrhoids appreciated.  No active bleeding. Musculoskeletal:     Right lower leg: No edema.     Left lower leg: No edema.  Skin:    General: Skin is warm and dry.     Capillary Refill: Capillary refill takes less than 2 seconds.  Neurological:     Mental Status: She is alert and oriented to person, place, and time.  Psychiatric:        Mood and Affect: Mood normal.        Behavior: Behavior normal.     ED Results / Procedures / Treatments   Labs (all labs ordered are listed, but only abnormal results are displayed) Labs Reviewed  BASIC METABOLIC PANEL - Abnormal; Notable for the following components:      Result Value   Glucose, Bld  112 (*)    BUN 25 (*)    Calcium 8.7 (*)    All other components within normal limits  CBC    EKG None  Radiology No results found.  Procedures Procedures  {Document cardiac monitor, telemetry assessment procedure when appropriate:1}  Medications Ordered in ED Medications - No data to display  ED Course/ Medical Decision Making/ A&P   {   Click here for ABCD2, HEART and other calculatorsREFRESH Note before signing :1}                              Medical Decision Making 47 year old female presenting with blood in stool after constipation.  Amount and/or Complexity of Data Reviewed Labs: ordered.   ***  {Document critical care time when appropriate:1} {Document review of labs and clinical decision tools ie heart score, Chads2Vasc2 etc:1}  {Document your independent review of radiology images, and any outside records:1} {Document your discussion with family members, caretakers, and with consultants:1} {Document social determinants of health affecting pt's care:1} {Document your decision making why or why not admission, treatments were needed:1} Final Clinical Impression(s) / ED Diagnoses Final diagnoses:  None    Rx / DC Orders ED Discharge Orders     None

## 2023-07-11 ENCOUNTER — Other Ambulatory Visit: Payer: Self-pay

## 2023-07-11 ENCOUNTER — Encounter (HOSPITAL_BASED_OUTPATIENT_CLINIC_OR_DEPARTMENT_OTHER): Payer: Self-pay

## 2023-07-11 ENCOUNTER — Emergency Department (HOSPITAL_BASED_OUTPATIENT_CLINIC_OR_DEPARTMENT_OTHER)

## 2023-07-11 ENCOUNTER — Emergency Department (HOSPITAL_BASED_OUTPATIENT_CLINIC_OR_DEPARTMENT_OTHER)
Admission: EM | Admit: 2023-07-11 | Discharge: 2023-07-11 | Disposition: A | Discharge status: Home / Self Care | Attending: Emergency Medicine | Admitting: Emergency Medicine

## 2023-07-11 DIAGNOSIS — R109 Unspecified abdominal pain: Secondary | ICD-10-CM

## 2023-07-11 DIAGNOSIS — R1084 Generalized abdominal pain: Secondary | ICD-10-CM | POA: Diagnosis present

## 2023-07-11 DIAGNOSIS — K5900 Constipation, unspecified: Secondary | ICD-10-CM | POA: Diagnosis not present

## 2023-07-11 DIAGNOSIS — R110 Nausea: Secondary | ICD-10-CM | POA: Diagnosis not present

## 2023-07-11 LAB — COMPREHENSIVE METABOLIC PANEL WITH GFR
ALT: 21 U/L (ref 0–44)
AST: 18 U/L (ref 15–41)
Albumin: 4.1 g/dL (ref 3.5–5.0)
Alkaline Phosphatase: 57 U/L (ref 38–126)
Anion gap: 10 (ref 5–15)
BUN: 11 mg/dL (ref 6–20)
CO2: 25 mmol/L (ref 22–32)
Calcium: 9.6 mg/dL (ref 8.9–10.3)
Chloride: 105 mmol/L (ref 98–111)
Creatinine, Ser: 0.64 mg/dL (ref 0.44–1.00)
GFR, Estimated: >60 mL/min (ref >60)
Glucose, Bld: 74 mg/dL (ref 70–99)
Potassium: 4 mmol/L (ref 3.5–5.1)
Sodium: 140 mmol/L (ref 135–145)
Total Bilirubin: 0.4 mg/dL (ref 0.0–1.2)
Total Protein: 6.9 g/dL (ref 6.5–8.1)

## 2023-07-11 LAB — CBC WITH DIFFERENTIAL/PLATELET
Abs Immature Granulocytes: 0.01 10*3/uL (ref 0.00–0.07)
Basophils Absolute: 0.1 10*3/uL (ref 0.0–0.1)
Basophils Relative: 1 %
Eosinophils Absolute: 0.3 10*3/uL (ref 0.0–0.5)
Eosinophils Relative: 4 %
HCT: 40 % (ref 36.0–46.0)
Hemoglobin: 13 g/dL (ref 12.0–15.0)
Immature Granulocytes: 0 %
Lymphocytes Relative: 39 %
Lymphs Abs: 2.5 10*3/uL (ref 0.7–4.0)
MCH: 28.7 pg (ref 26.0–34.0)
MCHC: 32.5 g/dL (ref 30.0–36.0)
MCV: 88.3 fL (ref 80.0–100.0)
Monocytes Absolute: 0.3 10*3/uL (ref 0.1–1.0)
Monocytes Relative: 5 %
Neutro Abs: 3.2 10*3/uL (ref 1.7–7.7)
Neutrophils Relative %: 51 %
Platelets: 168 10*3/uL (ref 150–400)
RBC: 4.53 MIL/uL (ref 3.87–5.11)
RDW: 12.8 % (ref 11.5–15.5)
WBC: 6.3 10*3/uL (ref 4.0–10.5)
nRBC: 0 % (ref 0.0–0.2)

## 2023-07-11 MED ORDER — IOHEXOL 300 MG/ML  SOLN
100.0000 mL | Freq: Once | INTRAMUSCULAR | Status: AC | PRN
  Administered 2023-07-11: 100 mL via INTRAVENOUS

## 2023-07-11 MED ORDER — SODIUM CHLORIDE 0.9 % IV BOLUS
500.0000 mL | Freq: Once | INTRAVENOUS | Status: AC
  Administered 2023-07-11: 500 mL via INTRAVENOUS

## 2023-07-11 NOTE — ED Triage Notes (Addendum)
 Pt reports constipated for "a while". Only able top get small amounts out. Pt has used multiple OTC /fleets without relief. Pt has hx of SBO

## 2023-07-11 NOTE — Discharge Instructions (Addendum)
 The workup was reassuring today.  He did not seem particularly constipated.  Your CT scan was reassuring.  You can follow-up with your doctor and with gastroenterology as planned.  There is a possible hemangioma on your CT scan will need to be followed.  Your primary care doctor can help with this.

## 2023-07-11 NOTE — ED Provider Notes (Signed)
 [place] [facility] Provider Note   CSN: [id number] Arrival date & time: 07/11/23  1409     History  Chief Complaint  Patient presents with   Constipation    Christine Berry is a 48 y.o. female.   Constipation Patient presents with abdominal pain constipation.  States that she normally has a bowel movement every day but has had none for around a week now.  Has had multiple different over-the-counter medications including suppositories without relief.  Has had previous episodes in the past with partial small bowel obstruction.  Has had previous C-section but was her only surgery.  No fevers.  Some nausea without vomiting.  Still having gas but states less than normal.    Past Medical History:  Diagnosis Date   Anxiety    Drug abuse [facility])    Medical history non-contributory    SBO (small bowel obstruction) (HCC)     Home Medications Prior to Admission medications   Medication Sig Start Date End Date Taking? Authorizing Provider  baclofen (LIORESAL) 10 MG tablet Take 1 tablet (10 mg total) by mouth 3 (three) times daily as needed for muscle spasms. Patient not taking: Reported on 12/30/2017 10/31/15   Porfirio Oar, PA  fluticasone (FLONASE) 50 MCG/ACT nasal spray Place 2 sprays into both nostrils at bedtime. Patient not taking: Reported on 10/14/2022 05/10/15   Sherren Mocha, MD  ipratropium (ATROVENT) 0.03 % nasal spray Place 2 sprays into the nose 4 (four) times daily. Patient not taking: Reported on 10/14/2022 05/10/15   Sherren Mocha, MD  penicillin v potassium (VEETID) 500 MG tablet Take 1 tablet (500 mg total) by mouth 4 (four) times daily. Patient not taking: Reported on 10/14/2022 10/03/14   Earley Favor, NP  predniSONE (DELTASONE) 10 MG tablet TAKE 2 TABLETS(20 MG) BY MOUTH DAILY WITH BREAKFAST Patient not taking: Reported on 10/14/2022 12/16/16   Nadara Mustard, MD  sertraline (ZOLOFT) 50 MG tablet Take 1 tablet (50 mg total) by mouth  daily. Patient not taking: Reported on 12/30/2017 10/31/15   Sherren Mocha, MD  triamcinolone cream (KENALOG) 0.1 % Apply 1 application topically 2 (two) times daily. Patient not taking: Reported on 10/14/2022 04/12/15   Peyton Najjar, MD  TYLENOL 500 MG tablet Take 500-1,000 mg by mouth every 6 (six) hours as needed for mild pain or headache.    [provider]      Allergies    Patient has no known allergies.    Review of Systems   Review of Systems  Gastrointestinal:  Positive for constipation.    Physical Exam Updated Vital Signs BP 133/89 (BP Location: Right Wrist)   Pulse 71   Temp 98.7 F (37.1 C) (Oral)   Resp 18   Wt 113.4 kg   LMP 06/19/2023   SpO2 100%   BMI 40.35 kg/m  Physical Exam Vitals and nursing note reviewed.  Cardiovascular:     Rate and Rhythm: Regular rhythm.  Pulmonary:     Breath sounds: No wheezing.  Abdominal:     Tenderness: There is abdominal tenderness.     Comments: Mild diffuse abdominal tenderness without severe distention.  Neurological:     Mental Status: She is alert.     ED Results / Procedures / Treatments   Labs (all labs ordered are listed, but only abnormal results are displayed) Labs Reviewed  COMPREHENSIVE METABOLIC PANEL WITH GFR  CBC WITH DIFFERENTIAL/PLATELET    EKG None  Radiology CT ABDOMEN PELVIS W CONTRAST Result Date: 07/11/2023 CLINICAL DATA:  Abdominal pain.  Concern for bowel obstruction. EXAM: CT ABDOMEN AND PELVIS WITH CONTRAST TECHNIQUE: Multidetector CT imaging of the abdomen and pelvis was performed using the standard protocol following bolus administration of intravenous contrast. RADIATION DOSE REDUCTION: This exam was performed according to the departmental dose-optimization program which includes automated exposure control, adjustment of the mA and/or kV according to patient size and/or use of iterative reconstruction technique. CONTRAST:  OMNIPAQUE IOHEXOL 300 MG/ML  SOLN COMPARISON:  CT  abdomen pelvis dated 10/14/2022. FINDINGS: Lower chest: The visualized lung bases are clear. No intra-abdominal free air or free fluid. Hepatobiliary: Indeterminate 1.7 x 1.0 cm lesion with apparent central enhancement in the right lobe of the liver is not characterized but may represent a hemangioma. This can be better evaluated with MRI on a nonemergent/outpatient basis. Fatty liver. No biliary dilatation. The gallbladder is unremarkable. Pancreas: Unremarkable. No pancreatic ductal dilatation or surrounding inflammatory changes. Spleen: Normal in size without focal abnormality. Adrenals/Urinary Tract: The adrenal glands are unremarkable. There is an 8 mm nonobstructing left renal inferior pole calculus. No hydronephrosis. The right kidney is unremarkable. There is symmetric enhancement and excretion of contrast by both kidneys. The visualized ureters and urinary bladder appear unremarkable. Stomach/Bowel: There is no bowel obstruction or active inflammation. The appendix is normal. Vascular/Lymphatic: The abdominal aorta and IVC are unremarkable. No portal venous gas. There is no adenopathy. Reproductive: The uterus and ovaries are grossly unremarkable. No suspicious adnexal masses. Other: None Musculoskeletal: Disc desiccation and vacuum phenomena at L4-5. No acute osseous pathology. IMPRESSION: 1. No acute intra-abdominal or pelvic pathology. 2. An 8 mm nonobstructing left renal inferior pole calculus. No hydronephrosis. 3. Fatty liver. 4. Indeterminate lesion in the right lobe of the liver. Further evaluation with MRI without and with contrast on a nonemergent/outpatient basis recommended. Electronically Signed   By: Elgie Collard M.D.   On: 07/11/2023 16:57    Procedures Procedures    Medications Ordered in ED Medications  sodium chloride 0.9 % bolus 500 mL (500 mLs Intravenous New Bag/Given 07/11/23 1544)  iohexol (OMNIPAQUE) 300 MG/ML solution 100 mL (100 mLs Intravenous Contrast Given 07/11/23  1638)    ED Course/ Medical Decision Making/ A&P                                 Medical Decision Making Amount and/or Complexity of Data Reviewed Labs: ordered. Radiology: ordered.  Risk Prescription drug management.   Patient with constipation abdominal pain.  History of same with small bowel obstruction.  No relief with home treatments.  Differential diagnosis does include obstruction, constipation, colitis.  Will get blood work and CT scan.  Reviewed discharge note from previous admission for the same.  Blood work reassuring.  CT scan done and reviewed with patient.  Does not have much constipation or stool.  No acute findings.  Does have liver lesion that will need to be followed.  Also kidney stone can be followed establish patient.  Will discharge home.        Final Clinical Impression(s) / ED Diagnoses Final diagnoses:  Abdominal pain, unspecified abdominal location    Rx / DC Orders ED Discharge Orders     None         Benjiman Core, MD 07/11/23 1722

## 2023-10-20 ENCOUNTER — Ambulatory Visit: Payer: Self-pay

## 2023-10-20 NOTE — Telephone Encounter (Signed)
 FYI Only or Action Required?: FYI only for provider.  Patient was last seen in primary care on New Patient.  Called Nurse Triage reporting Knee Pain.  Symptoms began x 2 weeks ago .  Interventions attempted: OTC medications: Aleve.  Symptoms are: gradually worsening.  Triage Disposition: See PCP When Office is Open (Within 3 Days)  Patient/caregiver understands and will follow disposition?: Yes  **Pt. Scheduled for New Patient appt. On 8/25; she agrees to be seen in [facility] for the symptoms**        Reason for Disposition  [1] MODERATE pain (e.g., interferes with normal activities, limping) AND [2] present > 3 days  Answer Assessment - Initial Assessment Questions 1. LOCATION and RADIATION: Where is the pain located?      Right knee pain  2. QUALITY: What does the pain feel like?  (e.g., sharp, dull, aching, burning)     Aching   3. SEVERITY: How bad is the pain? What does it keep you from doing?   (Scale 1-10; or mild, moderate, severe)     10/10 without knee brace, with brace no pain  4. ONSET: When did the pain start? Does it come and go, or is it there all the time?     X 2 weeks   5. RECURRENT: Have you had this pain before? If Yes, ask: When, and what happened then?     No  6. SETTING: Has there been any recent work, exercise or other activity that involved that part of the body?      No   7. AGGRAVATING FACTORS: What makes the knee pain worse? (e.g., walking, climbing stairs, running)     Walking   8. ASSOCIATED SYMPTOMS: Is there any swelling or redness of the knee?     Mild swelling    9. OTHER SYMPTOMS: Do you have any other symptoms? (e.g., calf pain, chest pain, difficulty breathing, fever) No    For home care she has taken Aleve.  Protocols used: Knee Pain-A-AH

## 2023-11-02 NOTE — Procedures (Signed)
Result scanned to media

## 2023-11-05 ENCOUNTER — Ambulatory Visit (HOSPITAL_COMMUNITY)
Admission: RE | Admit: 2023-11-05 | Discharge: 2023-11-05 | Disposition: A | Discharge status: Home / Self Care | Source: Ambulatory Visit | Attending: Vascular Surgery | Admitting: Vascular Surgery

## 2023-11-05 ENCOUNTER — Other Ambulatory Visit (HOSPITAL_COMMUNITY): Payer: Self-pay | Admitting: Medical

## 2023-11-05 DIAGNOSIS — M79604 Pain in right leg: Secondary | ICD-10-CM

## 2023-11-29 ENCOUNTER — Ambulatory Visit: Admitting: Internal Medicine

## 2024-01-24 ENCOUNTER — Telehealth: Payer: Self-pay

## 2024-01-24 ENCOUNTER — Ambulatory Visit

## 2024-01-24 NOTE — Telephone Encounter (Signed)
 Called pt and left vm to call office back to reschedule missed NP appt
